# Patient Record
Sex: Female | Born: 1990
Health system: Southern US, Community
[De-identification: ages and names within clinical notes are randomized; demographics above are authoritative.]

## PROBLEM LIST (undated history)

## (undated) DIAGNOSIS — D72819 Decreased white blood cell count, unspecified: Principal | ICD-10-CM

## (undated) DIAGNOSIS — R7303 Prediabetes: Secondary | ICD-10-CM

## (undated) HISTORY — DX: Decreased white blood cell count, unspecified: D72.819

---
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
## 2011-08-17 ENCOUNTER — Emergency Department (HOSPITAL_COMMUNITY)
Admission: EM | Admit: 2011-08-17 | Discharge: 2011-08-18 | Disposition: A | Discharge status: Home / Self Care | Payer: Managed Care, Other (non HMO) | Attending: Emergency Medicine | Admitting: Emergency Medicine

## 2011-08-17 DIAGNOSIS — N72 Inflammatory disease of cervix uteri: Secondary | ICD-10-CM | POA: Insufficient documentation

## 2011-08-17 DIAGNOSIS — E119 Type 2 diabetes mellitus without complications: Secondary | ICD-10-CM | POA: Insufficient documentation

## 2011-08-17 DIAGNOSIS — R110 Nausea: Secondary | ICD-10-CM | POA: Insufficient documentation

## 2011-08-17 DIAGNOSIS — M545 Low back pain, unspecified: Secondary | ICD-10-CM | POA: Insufficient documentation

## 2011-08-17 DIAGNOSIS — R109 Unspecified abdominal pain: Secondary | ICD-10-CM | POA: Insufficient documentation

## 2011-08-17 DIAGNOSIS — N739 Female pelvic inflammatory disease, unspecified: Secondary | ICD-10-CM | POA: Insufficient documentation

## 2011-08-17 LAB — WET PREP, GENITAL: Yeast Wet Prep HPF POC: NONE SEEN

## 2011-08-17 LAB — URINALYSIS, ROUTINE W REFLEX MICROSCOPIC
Bilirubin Urine: NEGATIVE
Glucose, UA: NEGATIVE mg/dL
Hgb urine dipstick: NEGATIVE
Ketones, ur: NEGATIVE mg/dL
Protein, ur: NEGATIVE mg/dL
Urobilinogen, UA: 1 mg/dL (ref 0.0–1.0)

## 2011-08-18 LAB — GC/CHLAMYDIA PROBE AMP, GENITAL
Chlamydia, DNA Probe: NEGATIVE
GC Probe Amp, Genital: NEGATIVE

## 2011-08-19 LAB — URINE CULTURE: Culture  Setup Time: 201209220542

## 2011-10-03 ENCOUNTER — Emergency Department (HOSPITAL_COMMUNITY): Payer: Managed Care, Other (non HMO)

## 2011-10-03 ENCOUNTER — Emergency Department (HOSPITAL_COMMUNITY)
Admission: EM | Admit: 2011-10-03 | Discharge: 2011-10-03 | Disposition: A | Discharge status: Home / Self Care | Payer: Managed Care, Other (non HMO) | Attending: Emergency Medicine | Admitting: Emergency Medicine

## 2011-10-03 DIAGNOSIS — N898 Other specified noninflammatory disorders of vagina: Secondary | ICD-10-CM | POA: Insufficient documentation

## 2011-10-03 DIAGNOSIS — F17200 Nicotine dependence, unspecified, uncomplicated: Secondary | ICD-10-CM | POA: Insufficient documentation

## 2011-10-03 DIAGNOSIS — E119 Type 2 diabetes mellitus without complications: Secondary | ICD-10-CM | POA: Insufficient documentation

## 2011-10-03 DIAGNOSIS — R109 Unspecified abdominal pain: Secondary | ICD-10-CM | POA: Insufficient documentation

## 2011-10-03 DIAGNOSIS — O200 Threatened abortion: Secondary | ICD-10-CM | POA: Insufficient documentation

## 2011-10-03 LAB — TYPE AND SCREEN: Antibody Screen: NEGATIVE

## 2011-10-03 LAB — BASIC METABOLIC PANEL
Chloride: 102 mEq/L (ref 96–112)
GFR calc Af Amer: >90 mL/min (ref >90)
GFR calc non Af Amer: >90 mL/min (ref >90)
Potassium: 3.4 mEq/L — ABNORMAL LOW (ref 3.5–5.1)
Sodium: 138 mEq/L (ref 135–145)

## 2011-10-03 LAB — POCT PREGNANCY, URINE: Preg Test, Ur: POSITIVE

## 2011-10-03 LAB — URINALYSIS, ROUTINE W REFLEX MICROSCOPIC
Bilirubin Urine: NEGATIVE
Glucose, UA: NEGATIVE mg/dL
Leukocytes, UA: NEGATIVE
Nitrite: NEGATIVE
Specific Gravity, Urine: 1.014 (ref 1.005–1.030)
pH: 7.5 (ref 5.0–8.0)

## 2011-10-03 LAB — WET PREP, GENITAL: Yeast Wet Prep HPF POC: NONE SEEN

## 2011-10-03 NOTE — ED Notes (Signed)
Just found out she was pregnant Monday and then went to the health dept, bleeding vaginally.

## 2011-10-03 NOTE — ED Notes (Signed)
Family at bedside. 

## 2011-10-03 NOTE — ED Notes (Signed)
Patient is resting comfortably. 

## 2011-10-03 NOTE — ED Provider Notes (Signed)
History     CSN: [id number] Arrival date & time: 10/03/2011 11:52 AM   First MD Initiated Contact with Patient 10/03/11 1316      Chief Complaint  Patient presents with  . Miscarriage    seen at [facility]     HPI 4:00 PM Patient seen and evaluated for vaginal bleeding. Patient did a home pregnancy test on Monday, that showed she was pregnant. Patient was seen at the health department today for evaluation of her vaginal bleeding that started yesterday. Patient denies any nausea, vomiting, fevers, back pain. Patient reports having some mild abdominal cramping. Patient is G1P0. Patient reports that she was diagnosed as being a diabetic, diet controlled. Reports that she has not ate today.   Past Medical History  Diagnosis Date  . Diabetes mellitus     History reviewed. No pertinent past surgical history.  History reviewed. No pertinent family history.  History  Substance Use Topics  . Smoking status: Current Everyday Smoker -- 0.5 packs/day    Types: Cigarettes  . Smokeless tobacco: Not on file  . Alcohol Use: Yes     socially.    OB History    Grav Para Term Preterm Abortions TAB SAB Ect Mult Living                  Review of Systems  Constitutional: Negative for fever, chills, diaphoresis and activity change.  HENT: Negative for neck pain and neck stiffness.   Eyes: Negative for photophobia and visual disturbance.  Respiratory: Negative for shortness of breath.   Cardiovascular: Negative for chest pain and leg swelling.  Gastrointestinal: Negative for nausea, vomiting, diarrhea, constipation and blood in stool.       Minimal abdominal cramping, intermittent in nature  Genitourinary: Positive for vaginal bleeding. Negative for dysuria, flank pain, vaginal discharge, vaginal pain and pelvic pain.  Musculoskeletal: Negative for gait problem.  Skin: Negative for color change and rash.  Neurological: Negative for dizziness, seizures and facial asymmetry.  All other  systems reviewed and are negative.    Allergies  Review of patient's allergies indicates no known allergies.  Home Medications  No current outpatient prescriptions on file.  BP 119/74  Pulse 90  Temp(Src) 98 F (36.7 C) (Oral)  Resp 16  SpO2 98%  LMP 08/26/2011  4:03 PM PE completed. Physical Exam  Nursing note and vitals reviewed. Constitutional: She is oriented to person, place, and time. She appears well-developed and well-nourished. No distress.  HENT:  Head: Normocephalic and atraumatic.  Eyes: EOM are normal. Pupils are equal, round, and reactive to light.  Neck: Normal range of motion.  Abdominal: Soft. Bowel sounds are normal. She exhibits no distension and no mass. There is no tenderness. There is no rebound and no guarding.  Genitourinary: Vagina normal and uterus normal. Cervix exhibits no discharge and no friability.       Chaperone present during exam. No fetal tissue in the cervix. Cervix closed. Minimal vaginal discharge. Minimal vaginal bleeding.    Musculoskeletal: Normal range of motion.  Neurological: She is alert and oriented to person, place, and time. She exhibits normal muscle tone.  Skin: Skin is warm and dry. No rash noted. She is not diaphoretic.  Psychiatric: She has a normal mood and affect. Her behavior is normal. Judgment and thought content normal.      ED Course  Procedures (including critical care time)  Patient seen and evaluated.  VS reviewed. Nursing notes reviewed. Discussed with attending physician, Dr. Weldon Inches.  Initial testing ordered. Will monitor the patient closely. They agree with the treatment plan and diagnosis.   Results for orders placed during the hospital encounter of 10/03/11  URINALYSIS, ROUTINE W REFLEX MICROSCOPIC      Component Value Range   Color, Urine YELLOW  YELLOW    Appearance CLEAR  CLEAR    Specific Gravity, Urine 1.014  1.005 - 1.030    pH 7.5  5.0 - 8.0    Glucose, UA NEGATIVE  NEGATIVE (mg/dL)   Hgb  urine dipstick NEGATIVE  NEGATIVE    Bilirubin Urine NEGATIVE  NEGATIVE    Ketones, ur NEGATIVE  NEGATIVE (mg/dL)   Protein, ur NEGATIVE  NEGATIVE (mg/dL)   Urobilinogen, UA 1.0  0.0 - 1.0 (mg/dL)   Nitrite NEGATIVE  NEGATIVE    Leukocytes, UA NEGATIVE  NEGATIVE   TYPE AND SCREEN      Component Value Range   ABO/RH(D) O POS     Antibody Screen NEG     Sample Expiration 10/06/2011    POCT PREGNANCY, URINE      Component Value Range   Preg Test, Ur POSITIVE    ABO/RH      Component Value Range   ABO/RH(D) O POS    HCG, QUANTITATIVE, PREGNANCY      Component Value Range   hCG, Beta Chain, Quant, S 1973 (*) <5 (mIU/mL)  BASIC METABOLIC PANEL      Component Value Range   Sodium 138  135 - 145 (mEq/L)   Potassium 3.4 (*) 3.5 - 5.1 (mEq/L)   Chloride 102  96 - 112 (mEq/L)   CO2 25  19 - 32 (mEq/L)   Glucose, Bld 65 (*) 70 - 99 (mg/dL)   BUN 6  6 - 23 (mg/dL)   Creatinine, Ser 6.96  0.50 - 1.10 (mg/dL)   Calcium 9.4  8.4 - 29.5 (mg/dL)   GFR calc non Af Amer >90  >90 (mL/min)   GFR calc Af Amer >90  >90 (mL/min)   US Ob Comp Less 14 Wks  10/03/2011  *RADIOLOGY REPORT*  Clinical Data: Pregnant with vaginal bleeding  OBSTETRIC <14 WK [place] AND TRANSVAGINAL OB US  Technique:  Both transabdominal and transvaginal ultrasound examinations were performed for complete evaluation of the gestation as well as the maternal uterus, adnexal regions, and pelvic cul-de-sac.  Transvaginal technique was performed to assess early pregnancy.  Comparison:  None.  Intrauterine gestational sac: Single gestational sac is visualized Yolk sac: Not visualized Embryo: Not visualized Cardiac Activity: Not visualized  MSD: 5.1  mm  5    w 2    d             [place] EDC: 06/02/1930  Maternal uterus/adnexae: Bilateral ovary is normal sized without evidence of ovarian cyst or adnexal mass.  No pelvic free fluid.  No subchorionic hemorrhage.  IMPRESSION: Single intrauterine gestational sac measuring 5.1 mm corresponding to  gestational age of [redacted] weeks 2 days. No embryonic pole, yolk sac or cardiac activity identified.  The findings may be due to early pregnancy, blighted ovum  or incomplete abortion.  Correlation with serial beta HCG level, clinical exam and follow-up pelvic ultrasound is recommended.  Original Report Authenticated By: Natasha Mead, M.D.   US Ob Transvaginal  10/03/2011  *RADIOLOGY REPORT*  Clinical Data: Pregnant with vaginal bleeding  OBSTETRIC <14 WK [place] AND TRANSVAGINAL OB US  Technique:  Both transabdominal and transvaginal ultrasound examinations were performed for complete evaluation of the gestation as  well as the maternal uterus, adnexal regions, and pelvic cul-de-sac.  Transvaginal technique was performed to assess early pregnancy.  Comparison:  None.  Intrauterine gestational sac: Single gestational sac is visualized Yolk sac: Not visualized Embryo: Not visualized Cardiac Activity: Not visualized  MSD: 5.1  mm  5    w 2    d             [place] EDC: 06/02/1930  Maternal uterus/adnexae: Bilateral ovary is normal sized without evidence of ovarian cyst or adnexal mass.  No pelvic free fluid.  No subchorionic hemorrhage.  IMPRESSION: Single intrauterine gestational sac measuring 5.1 mm corresponding to gestational age of [redacted] weeks 2 days. No embryonic pole, yolk sac or cardiac activity identified.  The findings may be due to early pregnancy, blighted ovum  or incomplete abortion.  Correlation with serial beta HCG level, clinical exam and follow-up pelvic ultrasound is recommended.  Original Report Authenticated By: Natasha Mead, M.D.   5:11 PM Patient seen and re-evaluated. Resting comfortably. VSS stable. NAD. Patient notified of testing results. Stated agreement and understanding. Patient stated understanding to treatment plan and diagnosis. Patients blood glucose level was mildly low at 65, patient is alert and oriented x 3. No distress. Patient given food in the department. No medication taken today. Patient is not on  any anti-hyperglycemic medications.   6:35 PM patient seen and re-evaluated. Resting comfortably. NAD. Patient ate some food. CBG 87, will send home with gynecology follow up. Patient stated agreement and understanding.   MDM   Threatened Miscarriage         Demetrius Charity, [place] 10/03/11 1836

## 2011-10-03 NOTE — ED Provider Notes (Signed)
History     CSN: [id number] Arrival date & time: 10/03/2011 11:52 AM   First MD Initiated Contact with Patient 10/03/11 1316      Chief Complaint  Patient presents with  . Miscarriage    [facility]     (Consider location/radiation/quality/duration/timing/severity/associated sxs/prior treatment) HPI  Past Medical History  Diagnosis Date  . Diabetes mellitus     History reviewed. No pertinent past surgical history.  History reviewed. No pertinent family history.  History  Substance Use Topics  . Smoking status: Current Everyday Smoker -- 0.5 packs/day    Types: Cigarettes  . Smokeless tobacco: Not on file  . Alcohol Use: Yes     socially.    OB History    Grav Para Term Preterm Abortions TAB SAB Ect Mult Living                  Review of Systems  Allergies  Review of patient's allergies indicates no known allergies.  Home Medications  No current outpatient prescriptions on file.  BP 119/74  Pulse 90  Temp(Src) 98 F (36.7 C) (Oral)  Resp 16  SpO2 98%  LMP 08/26/2011  Physical Exam  ED Course  Procedures (including critical care time)  Labs Reviewed - No data to display No results found.   No diagnosis found.    MDM  Medical screening exam. Patient seen at health department this morning she was pregnant patient has had low bowel cramping and vaginal bleeding since yesterday. Patient told she may be having a miscarriage. On exam alert nontoxic-appearing Glasgow Coma Score 15 appears comfortable abdomen soft nontender        Doug Sou, MD 10/03/11 1323

## 2011-10-03 NOTE — ED Notes (Signed)
Informed patient that we need a urine sample.  

## 2011-10-04 LAB — GC/CHLAMYDIA PROBE AMP, GENITAL
Chlamydia, DNA Probe: NEGATIVE
GC Probe Amp, Genital: NEGATIVE

## 2011-10-04 NOTE — ED Provider Notes (Signed)
Medical screening examination/treatment/procedure(s) were performed by non-physician practitioner and as supervising physician I was immediately available for consultation/collaboration.  Nicholes Stairs, MD 10/04/11 [phone]

## 2011-10-10 ENCOUNTER — Emergency Department (HOSPITAL_COMMUNITY)
Admission: EM | Admit: 2011-10-10 | Discharge: 2011-10-10 | Disposition: A | Discharge status: Home / Self Care | Payer: Managed Care, Other (non HMO) | Attending: Emergency Medicine | Admitting: Emergency Medicine

## 2011-10-10 ENCOUNTER — Encounter (HOSPITAL_COMMUNITY): Payer: Self-pay | Admitting: Emergency Medicine

## 2011-10-10 DIAGNOSIS — R109 Unspecified abdominal pain: Secondary | ICD-10-CM | POA: Insufficient documentation

## 2011-10-10 DIAGNOSIS — N898 Other specified noninflammatory disorders of vagina: Secondary | ICD-10-CM | POA: Insufficient documentation

## 2011-10-10 DIAGNOSIS — M549 Dorsalgia, unspecified: Secondary | ICD-10-CM | POA: Insufficient documentation

## 2011-10-10 DIAGNOSIS — E119 Type 2 diabetes mellitus without complications: Secondary | ICD-10-CM | POA: Insufficient documentation

## 2011-10-10 NOTE — ED Notes (Signed)
Patient states is pregnant, Back and abdomen pain started Tuesday, bleeding started today,

## 2011-10-10 NOTE — ED Notes (Signed)
Awaiting pt from triage area

## 2011-10-11 NOTE — ED Provider Notes (Signed)
History     CSN: [id number] Arrival date & time: 10/10/2011  2:05 PM   First MD Initiated Contact with Patient 10/10/11 1741      Chief Complaint  Patient presents with  . Abdominal Pain  . Back Pain  . Vaginal Bleeding    (Consider location/radiation/quality/duration/timing/severity/associated sxs/prior treatment) HPI  Past Medical History  Diagnosis Date  . Diabetes mellitus     History reviewed. No pertinent past surgical history.  No family history on file.  History  Substance Use Topics  . Smoking status: Current Everyday Smoker -- 1.0 packs/day    Types: Cigarettes  . Smokeless tobacco: Never Used  . Alcohol Use: No     socially.    OB History    Grav Para Term Preterm Abortions TAB SAB Ect Mult Living                  Review of Systems  Allergies  Review of patient's allergies indicates no known allergies.  Home Medications  No current outpatient prescriptions on file.  BP 108/67  Pulse 72  Temp(Src) 98.2 F (36.8 C) (Oral)  Resp 20  SpO2 99%  LMP 08/26/2011  Physical Exam  ED Course  Procedures (including critical care time)  Labs Reviewed - No data to display No results found.   1. Abdominal pain       MDM  Patient lwsb prior to my evaluation.       Hilario Quarry, MD 10/11/11 [phone]

## 2012-02-07 ENCOUNTER — Emergency Department (HOSPITAL_COMMUNITY)
Admission: EM | Admit: 2012-02-07 | Discharge: 2012-02-07 | Disposition: A | Discharge status: Home / Self Care | Payer: Managed Care, Other (non HMO) | Attending: Emergency Medicine | Admitting: Emergency Medicine

## 2012-02-07 ENCOUNTER — Encounter (HOSPITAL_COMMUNITY): Payer: Self-pay | Admitting: Nurse Practitioner

## 2012-02-07 DIAGNOSIS — L309 Dermatitis, unspecified: Secondary | ICD-10-CM

## 2012-02-07 DIAGNOSIS — F17200 Nicotine dependence, unspecified, uncomplicated: Secondary | ICD-10-CM | POA: Insufficient documentation

## 2012-02-07 DIAGNOSIS — E119 Type 2 diabetes mellitus without complications: Secondary | ICD-10-CM | POA: Insufficient documentation

## 2012-02-07 DIAGNOSIS — L259 Unspecified contact dermatitis, unspecified cause: Secondary | ICD-10-CM | POA: Insufficient documentation

## 2012-02-07 MED ORDER — FAMOTIDINE 20 MG PO TABS: 20.0000 mg | ORAL_TABLET | Freq: Two times a day (BID) | ORAL | Status: DC

## 2012-02-07 NOTE — ED Provider Notes (Signed)
History     CSN: [id number]  Arrival date & time 02/07/12  1441   First MD Initiated Contact with Patient 02/07/12 1533      Chief Complaint  Patient presents with  . Rash    (Consider location/radiation/quality/duration/timing/severity/associated sxs/prior treatment) Patient is a 21 y.o. female presenting with rash. The history is provided by the patient. No language interpreter was used.  Rash  This is a new problem. The current episode started yesterday. The problem has been gradually improving. The problem is associated with an unknown factor. There has been no fever. The rash is present on the torso, right upper leg and left upper leg. The patient is experiencing no pain. The pain has been fluctuating since onset. Associated symptoms include blisters. Pertinent negatives include no itching. She has tried antihistamines for the symptoms. The treatment provided significant relief.  No known exposure to environmental factors.  Recent viral illness.  Significant improvement after benadryl just PTA.  Past Medical History  Diagnosis Date  . Diabetes mellitus     History reviewed. No pertinent past surgical history.  History reviewed. No pertinent family history.  History  Substance Use Topics  . Smoking status: Current Everyday Smoker -- 0.5 packs/day    Types: Cigarettes  . Smokeless tobacco: Never Used  . Alcohol Use: Yes     socially.    OB History    Grav Para Term Preterm Abortions TAB SAB Ect Mult Living                  Review of Systems  Skin: Positive for rash. Negative for itching.  All other systems reviewed and are negative.    Allergies  Review of patient's allergies indicates no known allergies.  Home Medications   Current Outpatient Rx  Name Route Sig Dispense Refill  . DIPHENHYDRAMINE HCL 25 MG PO CAPS Oral Take 25 mg by mouth every 6 (six) hours as needed. For itching    . ORTHO-CYCLEN (28) PO Oral Take 1 tablet by mouth daily.      BP  104/70  Pulse 90  Temp(Src) 97.4 F (36.3 C) (Oral)  Resp 18  Ht 5\' 6"  (1.676 m)  Wt 155 lb (70.308 kg)  BMI 25.02 kg/m2  SpO2 100%  Physical Exam  Constitutional: She is oriented to person, place, and time. She appears well-developed and well-nourished.  HENT:  Head: Normocephalic and atraumatic.  Eyes: Pupils are equal, round, and reactive to light.  Neck: Normal range of motion. Neck supple.  Cardiovascular: Normal rate, regular rhythm, normal heart sounds and intact distal pulses.   Pulmonary/Chest: Effort normal and breath sounds normal.  Abdominal: Soft. Bowel sounds are normal.  Musculoskeletal: Normal range of motion. She exhibits no edema and no tenderness.  Lymphadenopathy:    She has no cervical adenopathy.  Neurological: She is alert and oriented to person, place, and time.  Skin: Skin is warm and dry. Rash noted.  Psychiatric: She has a normal mood and affect. Her behavior is normal. Judgment and thought content normal.  Mild papular rash noted to waistline, bilateral thighs.  ED Course  Procedures (including critical care time)  Labs Reviewed - No data to display No results found.   No diagnosis found.  Dermatitis--contact vs viral exanthem  MDM          Jimmye Norman, NP 02/07/12 [phone]

## 2012-02-07 NOTE — Discharge Instructions (Signed)
Contact Dermatitis Contact dermatitis is a rash that happens when something touches the skin. You touched something that irritates your skin, or you have allergies to something you touched. HOME CARE   Avoid the thing that caused your rash.   Keep your rash away from hot water, soap, sunlight, chemicals, and other things that might bother it.   Do not scratch your rash.   You can take cool baths to help stop itching.   Only take medicine as told by your doctor.   Keep all doctor visits as told.  GET HELP RIGHT AWAY IF:   Your rash is not better after 3 days.   Your rash gets worse.   Your rash is puffy (swollen), tender, red, sore, or warm.   You have problems with your medicine.  MAKE SURE YOU:   Understand these instructions.   Will watch your condition.   Will get help right away if you are not doing well or get worse.  Document Released: 09/09/2009 Document Revised: 11/01/2011 Document Reviewed: 04/17/2011 [facility] Patient Information 2012 [place], [place].  Continue to use the benadryl, 25 mg, every 8 hours as needed.  Add the pepcid as directed.  Follow-up with your primary care provider if no improvement over the next few days.

## 2012-02-07 NOTE — ED Notes (Signed)
States onset lacy red rash around beltline and on legs yesterday. "Feels like its burning my skin." Denies new meds/products/foods. Does not itch. Tried benadryl with no relief

## 2012-02-07 NOTE — ED Provider Notes (Signed)
Medical screening examination/treatment/procedure(s) were performed by non-physician practitioner and as supervising physician I was immediately available for consultation/collaboration.  Doug Sou, MD 02/07/12 2022

## 2012-08-13 ENCOUNTER — Encounter (HOSPITAL_BASED_OUTPATIENT_CLINIC_OR_DEPARTMENT_OTHER): Payer: Self-pay

## 2012-08-13 ENCOUNTER — Emergency Department (HOSPITAL_BASED_OUTPATIENT_CLINIC_OR_DEPARTMENT_OTHER): Payer: Worker's Compensation

## 2012-08-13 ENCOUNTER — Emergency Department (HOSPITAL_BASED_OUTPATIENT_CLINIC_OR_DEPARTMENT_OTHER)
Admission: EM | Admit: 2012-08-13 | Discharge: 2012-08-13 | Disposition: A | Discharge status: Home / Self Care | Payer: Worker's Compensation | Attending: Emergency Medicine | Admitting: Emergency Medicine

## 2012-08-13 DIAGNOSIS — T148XXA Other injury of unspecified body region, initial encounter: Secondary | ICD-10-CM

## 2012-08-13 DIAGNOSIS — E119 Type 2 diabetes mellitus without complications: Secondary | ICD-10-CM | POA: Insufficient documentation

## 2012-08-13 DIAGNOSIS — X500XXA Overexertion from strenuous movement or load, initial encounter: Secondary | ICD-10-CM | POA: Insufficient documentation

## 2012-08-13 DIAGNOSIS — Z87891 Personal history of nicotine dependence: Secondary | ICD-10-CM | POA: Insufficient documentation

## 2012-08-13 DIAGNOSIS — M546 Pain in thoracic spine: Secondary | ICD-10-CM | POA: Insufficient documentation

## 2012-08-13 MED ORDER — HYDROCODONE-ACETAMINOPHEN 5-325 MG PO TABS: 2.0000 | ORAL_TABLET | ORAL | Status: DC | PRN

## 2012-08-13 NOTE — ED Notes (Signed)
Moving desk at work yesterday "felt a pop" to left mid back-c/opain to left mid back-left arm and shoulder numbness

## 2012-08-13 NOTE — ED Provider Notes (Signed)
Medical screening examination/treatment/procedure(s) were performed by non-physician practitioner and as supervising physician I was immediately available for consultation/collaboration.  Doug Sou, MD 08/13/12 [phone]

## 2012-08-13 NOTE — ED Provider Notes (Signed)
History     CSN: [id number]  Arrival date & time 08/13/12  1657   First MD Initiated Contact with Patient 08/13/12 1736      Chief Complaint  Patient presents with  . Back Pain    (Consider location/radiation/quality/duration/timing/severity/associated sxs/prior treatment) Patient is a 21 y.o. female presenting with back pain. The history is provided by the patient. No language interpreter was used.  Back Pain  This is a new problem. The current episode started yesterday. The problem occurs constantly. The problem has been gradually worsening. The pain is associated with lifting heavy objects. The pain is present in the thoracic spine. The quality of the pain is described as stabbing. The pain is at a severity of 6/10. The pain is moderate. The pain is the same all the time. She has tried nothing for the symptoms. The treatment provided moderate relief.   Pt complains of pain in her back after moving furniture yesterday Past Medical History  Diagnosis Date  . Diabetes mellitus     History reviewed. No pertinent past surgical history.  No family history on file.  History  Substance Use Topics  . Smoking status: Former Smoker -- 0.5 packs/day  . Smokeless tobacco: Never Used  . Alcohol Use: No    OB History    Grav Para Term Preterm Abortions TAB SAB Ect Mult Living                  Review of Systems  Musculoskeletal: Positive for back pain.  All other systems reviewed and are negative.    Allergies  Review of patient's allergies indicates no known allergies.  Home Medications   Current Outpatient Rx  Name Route Sig Dispense Refill  . LEVONORGESTREL 20 MCG/24HR IU IUD Intrauterine 1 each by Intrauterine route once.      BP 109/63  Pulse 85  Temp 98.1 F (36.7 C) (Oral)  Resp 16  Ht 5\' 6"  (1.676 m)  Wt 160 lb (72.576 kg)  BMI 25.82 kg/m2  SpO2 98%  Physical Exam  Nursing note and vitals reviewed. Constitutional: She is oriented to person, place, and  time. She appears well-developed and well-nourished.  HENT:  Head: Normocephalic.  Eyes: Pupils are equal, round, and reactive to light.  Neck: Normal range of motion.  Cardiovascular: Normal rate.   Pulmonary/Chest: Effort normal.  Abdominal: Soft.  Musculoskeletal: She exhibits tenderness.       Tender mid back,  From arms and legs,    Neurological: She is alert and oriented to person, place, and time. She has normal reflexes. She displays normal reflexes.  Skin: Skin is warm.  Psychiatric: She has a normal mood and affect.    ED Course  Procedures (including critical care time)  Labs Reviewed - No data to display Dg Chest 2 View  08/13/2012  *RADIOLOGY REPORT*  Clinical Data: Pain  CHEST - 2 VIEW  Comparison: None  Findings: The heart size and mediastinal contours are within normal limits.  Both lungs are clear.  The visualized skeletal structures are unremarkable.  IMPRESSION: Negative exam.   Original Report Authenticated By: Rosealee Albee, M.D.      No diagnosis found.    MDM  Pt given rx for hydrocodone.   I advised ice,  Follow up with Dr. Pearletha Forge.  No liftin x 1 week        Lonia Skinner [place], [place] 08/13/12 1926

## 2012-08-21 ENCOUNTER — Emergency Department (HOSPITAL_BASED_OUTPATIENT_CLINIC_OR_DEPARTMENT_OTHER)
Admission: EM | Admit: 2012-08-21 | Discharge: 2012-08-21 | Disposition: A | Discharge status: Home / Self Care | Payer: Managed Care, Other (non HMO)

## 2012-08-22 ENCOUNTER — Encounter (HOSPITAL_COMMUNITY): Payer: Self-pay | Admitting: *Deleted

## 2012-08-22 ENCOUNTER — Emergency Department (HOSPITAL_COMMUNITY)
Admission: EM | Admit: 2012-08-22 | Discharge: 2012-08-23 | Disposition: A | Discharge status: Home / Self Care | Payer: Managed Care, Other (non HMO) | Attending: Emergency Medicine | Admitting: Emergency Medicine

## 2012-08-22 DIAGNOSIS — R102 Pelvic and perineal pain: Secondary | ICD-10-CM

## 2012-08-22 DIAGNOSIS — R10819 Abdominal tenderness, unspecified site: Secondary | ICD-10-CM | POA: Insufficient documentation

## 2012-08-22 DIAGNOSIS — N949 Unspecified condition associated with female genital organs and menstrual cycle: Secondary | ICD-10-CM | POA: Insufficient documentation

## 2012-08-22 DIAGNOSIS — F17200 Nicotine dependence, unspecified, uncomplicated: Secondary | ICD-10-CM | POA: Insufficient documentation

## 2012-08-22 DIAGNOSIS — E119 Type 2 diabetes mellitus without complications: Secondary | ICD-10-CM | POA: Insufficient documentation

## 2012-08-22 DIAGNOSIS — IMO0002 Reserved for concepts with insufficient information to code with codable children: Secondary | ICD-10-CM

## 2012-08-22 LAB — CBC WITH DIFFERENTIAL/PLATELET
Basophils Relative: 1 % (ref 0–1)
Eosinophils Relative: 5 % (ref 0–5)
HCT: 40.6 % (ref 36.0–46.0)
Hemoglobin: 13.8 g/dL (ref 12.0–15.0)
Lymphocytes Relative: 24 % (ref 12–46)
MCHC: 34 g/dL (ref 30.0–36.0)
MCV: 90 fL (ref 78.0–100.0)
Monocytes Absolute: 1 10*3/uL (ref 0.1–1.0)
Monocytes Relative: 8 % (ref 3–12)
Neutro Abs: 7.9 10*3/uL — ABNORMAL HIGH (ref 1.7–7.7)

## 2012-08-22 LAB — PREGNANCY, URINE: Preg Test, Ur: NEGATIVE

## 2012-08-22 NOTE — ED Notes (Signed)
Pt c/o severe abd pain since yesterday; nausea no vomiting; last bm Monday

## 2012-08-22 NOTE — ED Notes (Signed)
Pt reports generalized abdominal pain that started 1200 yesterday after intercourse. Pt reports "it hurt at the beginning then it became excruciating. I jumped up and went to the bathroom. It was the worst pain I've ever felt." Pt reports taking 2 hydrocodone after being seen at [facility] yesterday but to no relief. Pt reports nausea, no vomiting. Denies diarrhea.

## 2012-08-22 NOTE — ED Notes (Signed)
Pt ambulated to bathroom 

## 2012-08-22 NOTE — ED Notes (Addendum)
Pt reports "chest pain, but it seems like heartburn. I haven't had heartburn before, but I think it's what it feels like." Pt reports the pain is worse laying down. Pt placed on monitor, NSR.

## 2012-08-23 ENCOUNTER — Emergency Department (HOSPITAL_COMMUNITY): Payer: Managed Care, Other (non HMO)

## 2012-08-23 LAB — URINE MICROSCOPIC-ADD ON

## 2012-08-23 LAB — COMPREHENSIVE METABOLIC PANEL
BUN: 7 mg/dL (ref 6–23)
CO2: 26 mEq/L (ref 19–32)
Calcium: 9.4 mg/dL (ref 8.4–10.5)
Chloride: 102 mEq/L (ref 96–112)
Creatinine, Ser: 0.63 mg/dL (ref 0.50–1.10)
GFR calc non Af Amer: >90 mL/min (ref >90)
Total Bilirubin: 0.3 mg/dL (ref 0.3–1.2)

## 2012-08-23 LAB — URINALYSIS, ROUTINE W REFLEX MICROSCOPIC
Bilirubin Urine: NEGATIVE
Glucose, UA: NEGATIVE mg/dL
Hgb urine dipstick: NEGATIVE
Ketones, ur: NEGATIVE mg/dL
Protein, ur: NEGATIVE mg/dL

## 2012-08-23 LAB — GC/CHLAMYDIA PROBE AMP, GENITAL
Chlamydia, DNA Probe: NEGATIVE
GC Probe Amp, Genital: NEGATIVE

## 2012-08-23 MED ORDER — HYDROCODONE-ACETAMINOPHEN 5-325 MG PO TABS
2.0000 | ORAL_TABLET | Freq: Once | ORAL | Status: AC
  Administered 2012-08-23: 2 via ORAL
  Filled 2012-08-23: qty 2

## 2012-08-23 MED ORDER — HYDROCODONE-ACETAMINOPHEN 5-325 MG PO TABS: 2.0000 | ORAL_TABLET | ORAL | Status: DC | PRN

## 2012-08-23 MED ORDER — NAPROXEN 500 MG PO TABS
500.0000 mg | ORAL_TABLET | Freq: Once | ORAL | Status: AC
Start: 2012-08-23 — End: 2012-08-23
  Administered 2012-08-23: 500 mg via ORAL
  Filled 2012-08-23: qty 1

## 2012-08-23 MED ORDER — NAPROXEN 500 MG PO TABS: 500.0000 mg | ORAL_TABLET | Freq: Two times a day (BID) | ORAL | Status: DC

## 2012-08-23 NOTE — ED Provider Notes (Signed)
History     CSN: [id number]  Arrival date & time 08/22/12  2306   First MD Initiated Contact with Patient 08/22/12 2329      Chief Complaint  Patient presents with  . Abdominal Pain    (Consider location/radiation/quality/duration/timing/severity/associated sxs/prior treatment) HPI 21 year old female presents to emergency room complaining of pelvic pain since having sex yesterday. Patient reports severe lower pelvic pain at the end of intercourse. She denies any vaginal bleeding, no discharge no fever or chills. Patient's last bowel movement was Tuesday, which is normal for her. No rough sex, voice, or unusual positions. Patient has IUD in place. Patient describes distention and pressure to her lower abdomen. Patient has taken Vicodin without improvement in symptoms yesterday. Patient went to meds or [place], but left without being seen. She's had nausea but no vomiting. Past Medical History  Diagnosis Date  . Diabetes mellitus     History reviewed. No pertinent past surgical history.  No family history on file.  History  Substance Use Topics  . Smoking status: Current Every Day Smoker -- 0.5 packs/day    Types: Cigarettes  . Smokeless tobacco: Never Used  . Alcohol Use: No    OB History    Grav Para Term Preterm Abortions TAB SAB Ect Mult Living                  Review of Systems  All other systems reviewed and are negative.    Allergies  Review of patient's allergies indicates no known allergies.  Home Medications   Current Outpatient Rx  Name Route Sig Dispense Refill  . HYDROCODONE-ACETAMINOPHEN 5-325 MG PO TABS Oral Take 2 tablets by mouth every 4 (four) hours as needed. For pain    . LEVONORGESTREL 20 MCG/24HR IU IUD Intrauterine 1 each by Intrauterine route once.      BP 117/68  Pulse 71  Temp 97.6 F (36.4 C)  Resp 22  SpO2 100%  Physical Exam  Nursing note and vitals reviewed. Constitutional: She is oriented to person, place, and time. She  appears well-developed and well-nourished. No distress.  HENT:  Head: Normocephalic and atraumatic.  Right Ear: External ear normal.  Left Ear: External ear normal.  Nose: Nose normal.  Mouth/Throat: Oropharynx is clear and moist.  Eyes: Conjunctivae normal and EOM are normal. Pupils are equal, round, and reactive to light.  Neck: Normal range of motion. Neck supple. No JVD present. No tracheal deviation present. No thyromegaly present.  Cardiovascular: Normal rate, regular rhythm, normal heart sounds and intact distal pulses.  Exam reveals no gallop and no friction rub.   No murmur heard. Pulmonary/Chest: Effort normal and breath sounds normal. No stridor. No respiratory distress. She has no wheezes. She has no rales. She exhibits no tenderness.  Abdominal: Soft. Bowel sounds are normal. She exhibits no distension and no mass. There is tenderness (mild suprapubic tenderness). There is no rebound and no guarding.  Genitourinary: Vagina normal. No vaginal discharge found.       IUD in place, tenderness over entire pelvic exam without focality, masses or foreign objects noted  Musculoskeletal: Normal range of motion. She exhibits no edema and no tenderness.  Lymphadenopathy:    She has no cervical adenopathy.  Neurological: She is alert and oriented to person, place, and time. She exhibits normal muscle tone. Coordination normal.  Skin: Skin is warm and dry. No rash noted. No erythema. No pallor.  Psychiatric: She has a normal mood and affect. Her behavior is  normal. Judgment and thought content normal.    ED Course  Procedures (including critical care time)  Labs Reviewed  URINALYSIS, ROUTINE W REFLEX MICROSCOPIC - Abnormal; Notable for the following:    Leukocytes, UA SMALL (*)     All other components within normal limits  CBC WITH DIFFERENTIAL - Abnormal; Notable for the following:    WBC 12.5 (*)     Neutro Abs 7.9 (*)     All other components within normal limits  COMPREHENSIVE  METABOLIC PANEL - Abnormal; Notable for the following:    Potassium 3.4 (*)     All other components within normal limits  WET PREP, GENITAL - Abnormal; Notable for the following:    WBC, Wet Prep HPF POC FEW (*)     All other components within normal limits  URINE MICROSCOPIC-ADD ON - Abnormal; Notable for the following:    Squamous Epithelial / LPF FEW (*)     Bacteria, UA FEW (*)     All other components within normal limits  PREGNANCY, URINE  GC/CHLAMYDIA PROBE AMP, GENITAL   US Transvaginal Non-ob  08/23/2012  *RADIOLOGY REPORT*  Clinical Data:  Pelvic pain post intercourse.  Evaluate for ovarian torsion.  TRANSABDOMINAL AND TRANSVAGINAL ULTRASOUND OF PELVIS DOPPLER ULTRASOUND OF OVARIES  Technique:  Both transabdominal and transvaginal ultrasound examinations of the pelvis were performed. Transabdominal technique was performed for global imaging of the pelvis including uterus, ovaries, adnexal regions, and pelvic cul-de-sac.  It was necessary to proceed with endovaginal exam following the transabdominal exam to visualize the .  Color and duplex Doppler ultrasound was utilized to evaluate blood flow to the ovaries.  Comparison:  None.  Findings:  Uterus:  There is an IUD present.  Normal anteverted position of the uterus.  Uterus measures 7.5 x 3.2 x 5.2 cm.  Endometrium:  Endometrium is obscured by the IUD.  Right ovary: Right ovary measures 3.6 x 2.4 x 2.1 cm.  Small follicles within the right ovary.  Largest follicle measures up to 2.4 cm.  Arterial and venous flow within the right ovary.  Left ovary:   Left ovary measures 3.4 x 4.3 x 3.5 cm.  Small follicles in the left ovary.  There is an irregular follicle or involuting cyst in the left ovary that measures up to 2.4 cm. Arterial and venous flow within the left ovary.  Pulsed Doppler evaluation demonstrates normal low-resistance arterial and venous waveforms in both ovaries.  Small amount of fluid in the cul-de-sac.  IMPRESSION:  Bilateral  ovarian follicles.  There is an irregular shaped hypoechoic structure in the left ovary which could represent an involuting cyst.  Small amount of free fluid in the pelvis.  Presence of an IUD.  No sonographic evidence for ovarian torsion.   Original Report Authenticated By: Richarda Overlie, M.D.    US Pelvis Complete  08/23/2012  *RADIOLOGY REPORT*  Clinical Data:  Pelvic pain post intercourse.  Evaluate for ovarian torsion.  TRANSABDOMINAL AND TRANSVAGINAL ULTRASOUND OF PELVIS DOPPLER ULTRASOUND OF OVARIES  Technique:  Both transabdominal and transvaginal ultrasound examinations of the pelvis were performed. Transabdominal technique was performed for global imaging of the pelvis including uterus, ovaries, adnexal regions, and pelvic cul-de-sac.  It was necessary to proceed with endovaginal exam following the transabdominal exam to visualize the .  Color and duplex Doppler ultrasound was utilized to evaluate blood flow to the ovaries.  Comparison:  None.  Findings:  Uterus:  There is an IUD present.  Normal anteverted position  of the uterus.  Uterus measures 7.5 x 3.2 x 5.2 cm.  Endometrium:  Endometrium is obscured by the IUD.  Right ovary: Right ovary measures 3.6 x 2.4 x 2.1 cm.  Small follicles within the right ovary.  Largest follicle measures up to 2.4 cm.  Arterial and venous flow within the right ovary.  Left ovary:   Left ovary measures 3.4 x 4.3 x 3.5 cm.  Small follicles in the left ovary.  There is an irregular follicle or involuting cyst in the left ovary that measures up to 2.4 cm. Arterial and venous flow within the left ovary.  Pulsed Doppler evaluation demonstrates normal low-resistance arterial and venous waveforms in both ovaries.  Small amount of fluid in the cul-de-sac.  IMPRESSION:  Bilateral ovarian follicles.  There is an irregular shaped hypoechoic structure in the left ovary which could represent an involuting cyst.  Small amount of free fluid in the pelvis.  Presence of an IUD.  No  sonographic evidence for ovarian torsion.   Original Report Authenticated By: Richarda Overlie, M.D.    [place] Art/ven Flow Abd Pelv Doppler  08/23/2012  *RADIOLOGY REPORT*  Clinical Data:  Pelvic pain post intercourse.  Evaluate for ovarian torsion.  TRANSABDOMINAL AND TRANSVAGINAL ULTRASOUND OF PELVIS DOPPLER ULTRASOUND OF OVARIES  Technique:  Both transabdominal and transvaginal ultrasound examinations of the pelvis were performed. Transabdominal technique was performed for global imaging of the pelvis including uterus, ovaries, adnexal regions, and pelvic cul-de-sac.  It was necessary to proceed with endovaginal exam following the transabdominal exam to visualize the .  Color and duplex Doppler ultrasound was utilized to evaluate blood flow to the ovaries.  Comparison:  None.  Findings:  Uterus:  There is an IUD present.  Normal anteverted position of the uterus.  Uterus measures 7.5 x 3.2 x 5.2 cm.  Endometrium:  Endometrium is obscured by the IUD.  Right ovary: Right ovary measures 3.6 x 2.4 x 2.1 cm.  Small follicles within the right ovary.  Largest follicle measures up to 2.4 cm.  Arterial and venous flow within the right ovary.  Left ovary:   Left ovary measures 3.4 x 4.3 x 3.5 cm.  Small follicles in the left ovary.  There is an irregular follicle or involuting cyst in the left ovary that measures up to 2.4 cm. Arterial and venous flow within the left ovary.  Pulsed Doppler evaluation demonstrates normal low-resistance arterial and venous waveforms in both ovaries.  Small amount of fluid in the cul-de-sac.  IMPRESSION:  Bilateral ovarian follicles.  There is an irregular shaped hypoechoic structure in the left ovary which could represent an involuting cyst.  Small amount of free fluid in the pelvis.  Presence of an IUD.  No sonographic evidence for ovarian torsion.   Original Report Authenticated By: Richarda Overlie, M.D.      1. Pelvic pain in female   2. Dyspareunia       MDM  21 year old female with pelvic  pain after having intercourse. Workup unremarkable other than some diffuse tenderness on manual exam. Will treat with NSAIDs and continued Vicodin, heating packs, and abstinence from sex until feeling better.        Olivia Mackie, MD 08/23/12 [phone]

## 2012-08-23 NOTE — ED Notes (Signed)
US at bedside

## 2013-07-06 ENCOUNTER — Telehealth: Payer: Self-pay | Admitting: Hematology and Oncology

## 2013-07-06 NOTE — Telephone Encounter (Signed)
PT CALLED IN RE TO APPT PER PT SHE WILL CALL BACK TO MAKE APPT.

## 2013-07-06 NOTE — Telephone Encounter (Signed)
PT CALLED TO SCHEDULE NP APPT 08/28 @ 3 W/DR. VICTOR RFERRING DR. Sonny Masters SMITH DX- PERSISTENTLY ELEVATED WBC WELCOME PACKET MAILED.

## 2013-07-13 ENCOUNTER — Telehealth: Payer: Self-pay

## 2013-07-13 NOTE — Telephone Encounter (Signed)
C/D 07/13/13 for appt. 07/23/13

## 2013-07-23 ENCOUNTER — Ambulatory Visit (HOSPITAL_BASED_OUTPATIENT_CLINIC_OR_DEPARTMENT_OTHER): Payer: Managed Care, Other (non HMO) | Admitting: Hematology and Oncology

## 2013-07-23 ENCOUNTER — Encounter: Payer: Self-pay | Admitting: Hematology and Oncology

## 2013-07-23 ENCOUNTER — Ambulatory Visit: Payer: Managed Care, Other (non HMO)

## 2013-07-23 VITALS — BP 119/72 | HR 89 | Temp 98.4°F | Resp 20 | Ht 66.0 in | Wt 182.1 lb

## 2013-07-23 DIAGNOSIS — D72829 Elevated white blood cell count, unspecified: Secondary | ICD-10-CM

## 2013-07-23 DIAGNOSIS — F17200 Nicotine dependence, unspecified, uncomplicated: Secondary | ICD-10-CM

## 2013-07-23 NOTE — Progress Notes (Signed)
Checked in new patient with no financial issues. Her bcbs is primary and her [employer]et[employer] with dad is 2ndary. Didn't ask if poa/living will.

## 2013-07-24 ENCOUNTER — Telehealth: Payer: Self-pay | Admitting: Hematology and Oncology

## 2013-07-24 ENCOUNTER — Telehealth: Payer: Self-pay

## 2013-07-24 NOTE — Telephone Encounter (Signed)
lvm for pt for Sept and OCT appts...mailed pt appt sched avs and letter

## 2013-07-24 NOTE — Telephone Encounter (Signed)
pt called back to sched all appt for 8am on the same day.Marland KitchenMarland KitchenMarland KitchenDone

## 2013-07-24 NOTE — Telephone Encounter (Signed)
Calling pt back, she had ?? about schedule. LVM for her to call and ask for schedulers.

## 2013-07-26 IMAGING — US US TRANSVAGINAL NON-OB
1 series · 13 of 25 positions shown · non-contrast
Comparison: None.

CLINICAL DATA: Pelvic pain post intercourse.  Evaluate for ovarian
torsion.

TRANSABDOMINAL AND TRANSVAGINAL ULTRASOUND OF PELVIS
DOPPLER ULTRASOUND OF OVARIES
TECHNIQUE: Both transabdominal and transvaginal ultrasound
examinations of the pelvis were performed. Transabdominal technique
was performed for global imaging of the pelvis including uterus,
ovaries, adnexal regions, and pelvic cul-de-sac.
It was necessary to proceed with endovaginal exam following the
transabdominal exam to visualize the .
Color and duplex Doppler ultrasound was utilized to evaluate blood
flow to the ovaries.

[Series 1: us transvaginal non-ob · 0.24mm/px · 13 of 56 slices shown]
[im 1/56]
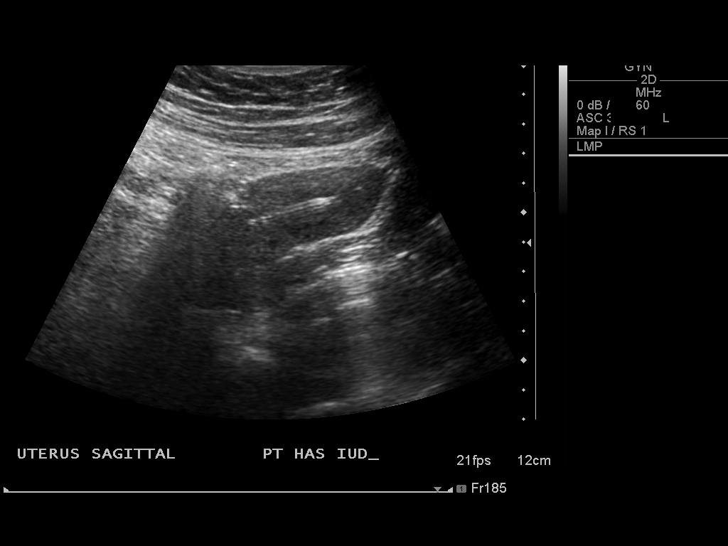
[im 5/56]
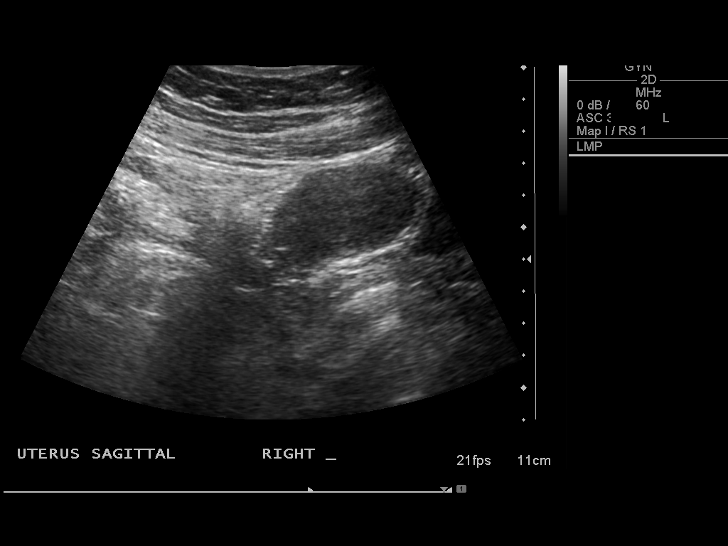
[im 10/56]
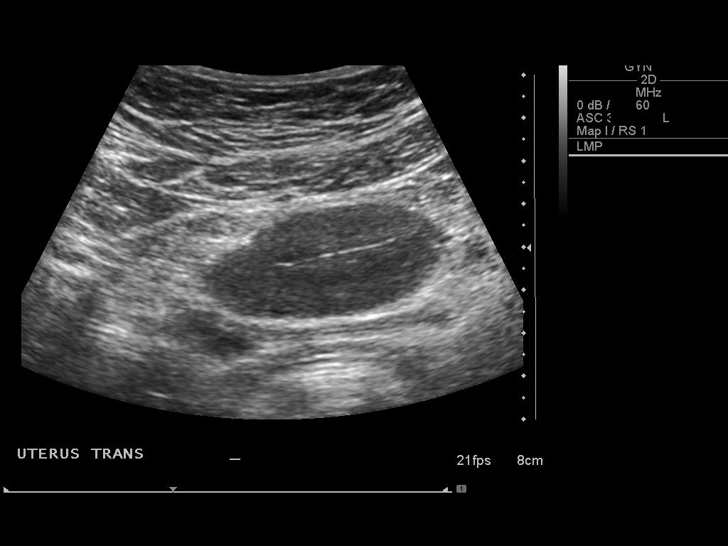
[im 14/56]
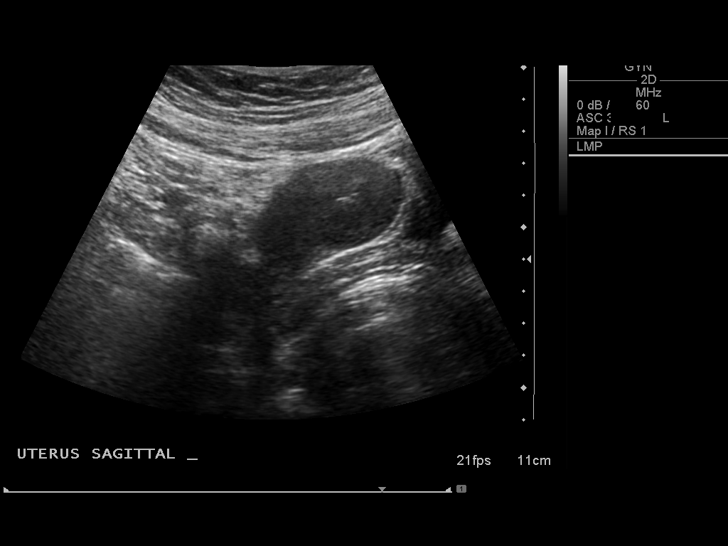
[im 19/56]
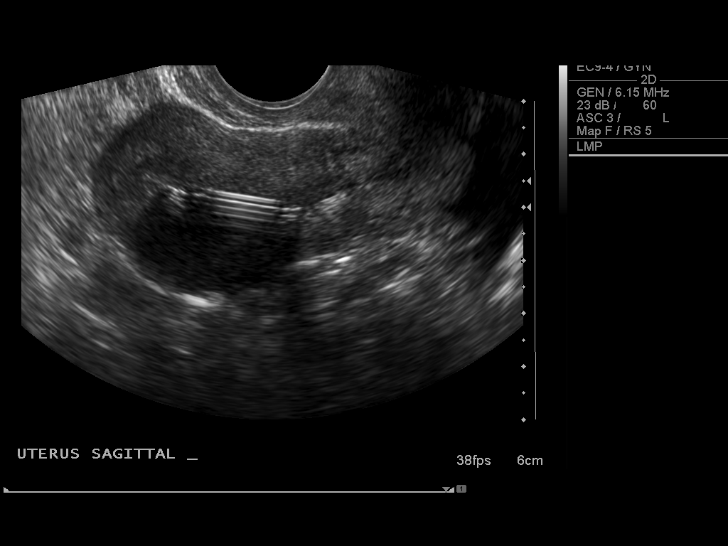
[im 23/56]
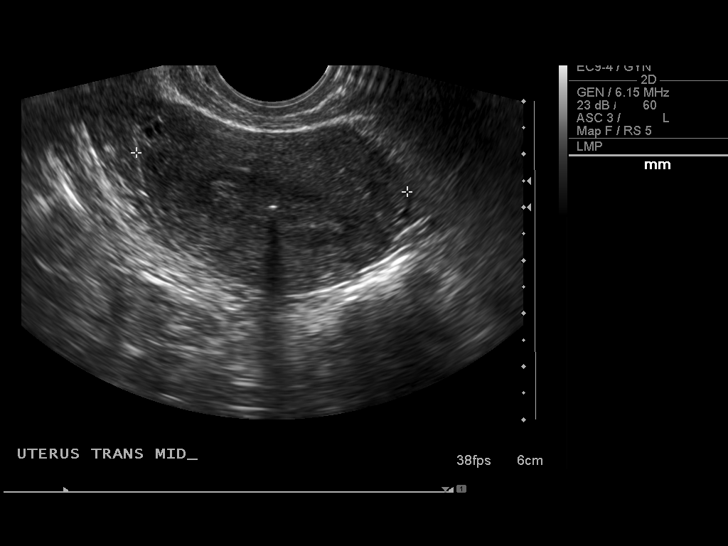
[im 28/56]
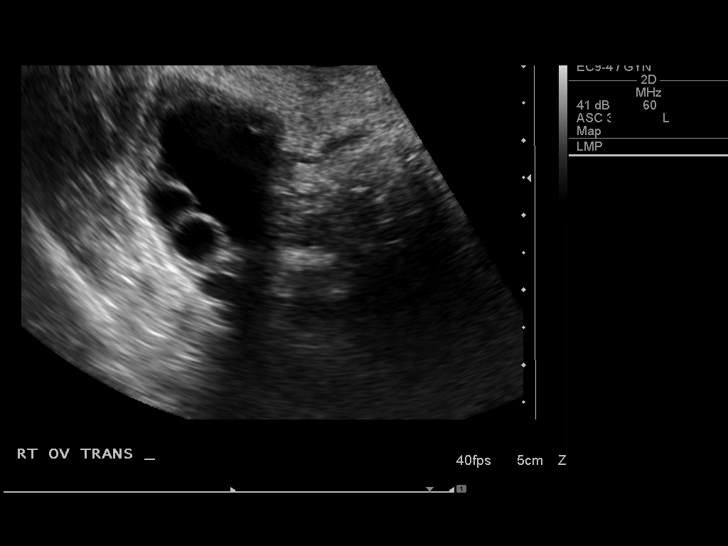
[im 33/56]
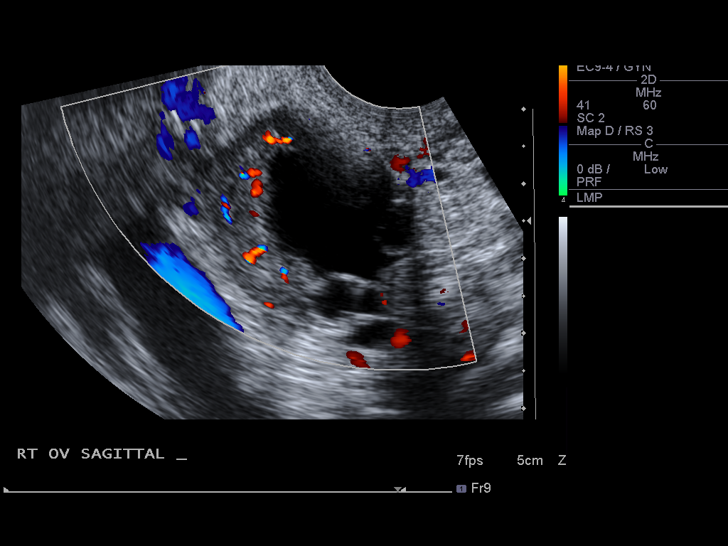
[im 37/56]
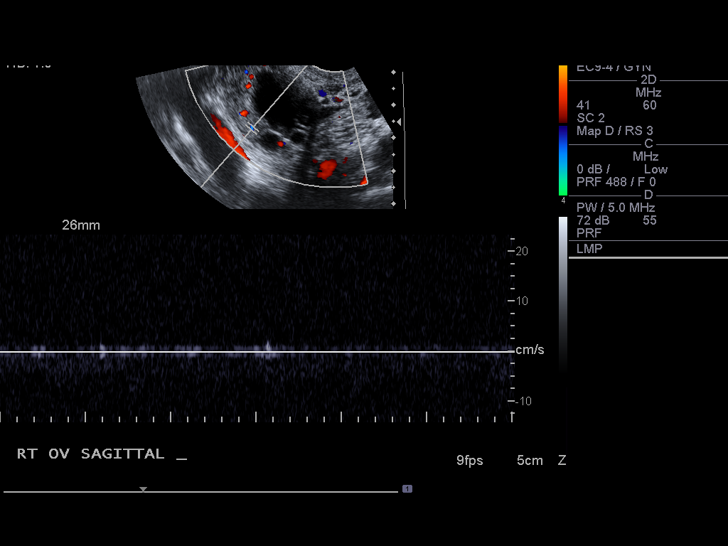
[im 42/56]
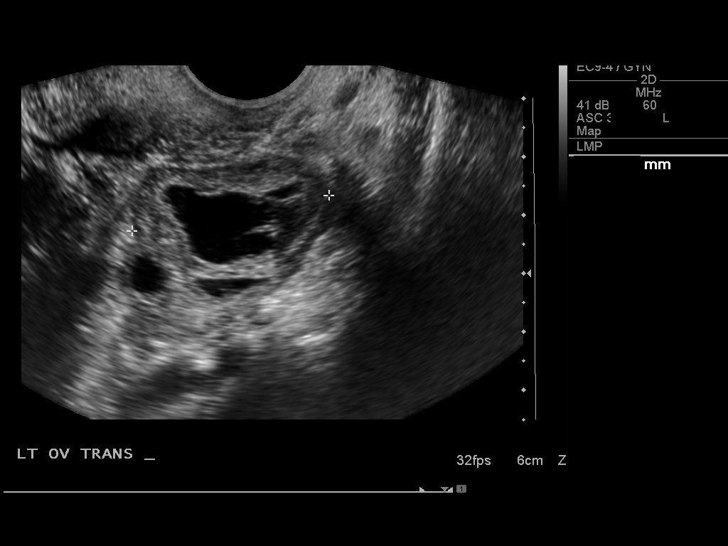
[im 46/56]
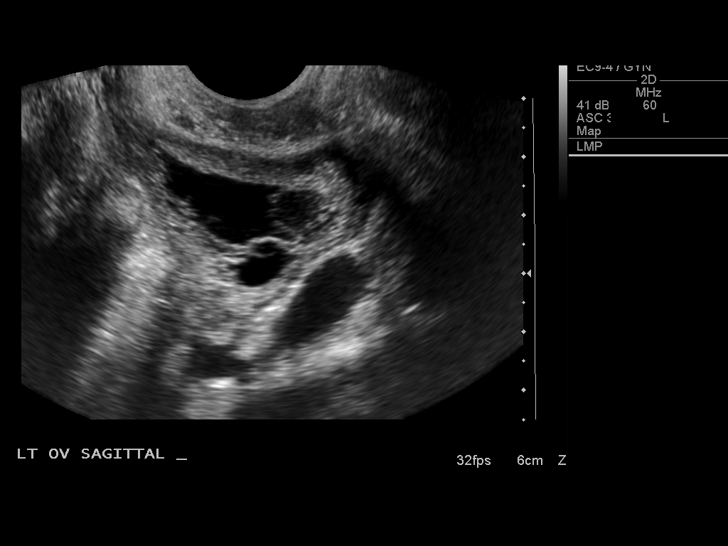
[im 51/56]
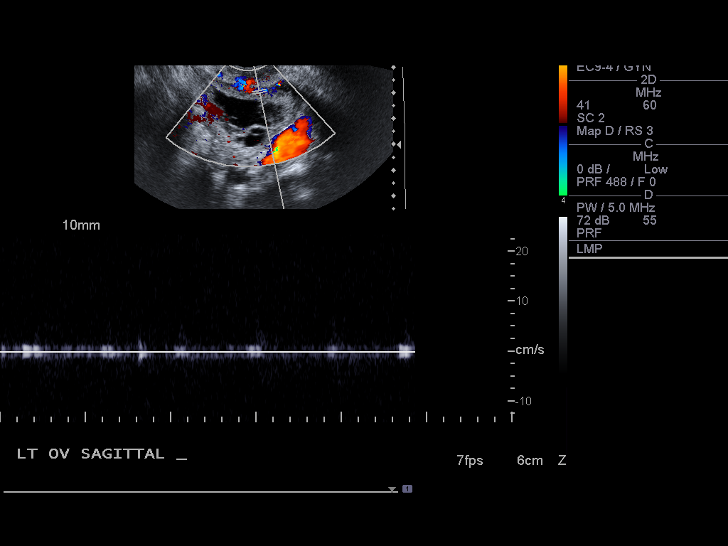
[im 56/56]
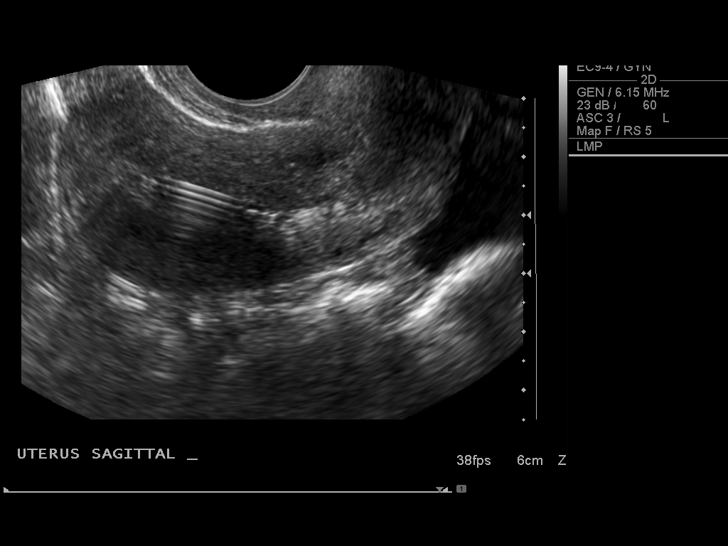

[13 of 25 positions shown; findings below may reference images not displayed]

FINDINGS: Uterus:  There is an IUD present.  Normal anteverted position of
the uterus.  Uterus measures 7.5 x 3.2 x 5.2 cm.

Endometrium:  Endometrium is obscured by the IUD.

Right ovary: Right ovary measures 3.6 x 2.4 x 2.1 cm.  Small
follicles within the right ovary.  Largest follicle measures up to
2.4 cm.  Arterial and venous flow within the right ovary.

Left ovary:   Left ovary measures 3.4 x 4.3 x 3.5 cm.  Small
follicles in the left ovary.  There is an irregular follicle or
involuting cyst in the left ovary that measures up to 2.4 cm.
Arterial and venous flow within the left ovary.

Pulsed Doppler evaluation demonstrates normal low-resistance
arterial and venous waveforms in both ovaries.

Small amount of fluid in the cul-de-sac.
IMPRESSION: Bilateral ovarian follicles.  There is an irregular shaped
hypoechoic structure in the left ovary which could represent an
involuting cyst.  Small amount of free fluid in the pelvis.

Presence of an IUD.

No sonographic evidence for ovarian torsion.

## 2013-08-04 ENCOUNTER — Telehealth: Payer: Self-pay | Admitting: Hematology and Oncology

## 2013-08-04 NOTE — Telephone Encounter (Signed)
Returned pt's call to confirm appts and also moved 10/9 f/u from CP2 to NG. Pt aware of appt d/t's for 9/11 and 10/9. 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

## 2013-08-06 ENCOUNTER — Other Ambulatory Visit: Payer: Self-pay

## 2013-08-06 ENCOUNTER — Telehealth: Payer: Self-pay | Admitting: *Deleted

## 2013-08-06 ENCOUNTER — Other Ambulatory Visit (HOSPITAL_BASED_OUTPATIENT_CLINIC_OR_DEPARTMENT_OTHER): Payer: BC Managed Care – PPO | Admitting: Lab

## 2013-08-06 DIAGNOSIS — D72829 Elevated white blood cell count, unspecified: Secondary | ICD-10-CM

## 2013-08-06 LAB — CBC WITH DIFFERENTIAL/PLATELET
Eosinophils Absolute: 0.3 10*3/uL (ref 0.0–0.5)
MONO#: 0.7 10*3/uL (ref 0.1–0.9)
MONO%: 6.9 % (ref 0.0–14.0)
NEUT#: 6.1 10*3/uL (ref 1.5–6.5)
RBC: 4.33 10*6/uL (ref 3.70–5.45)
RDW: 12.1 % (ref 11.2–14.5)
WBC: 9.7 10*3/uL (ref 3.9–10.3)
lymph#: 2.6 10*3/uL (ref 0.9–3.3)

## 2013-08-06 NOTE — Progress Notes (Signed)
ID: Mia Ortiz OB: Sep 28, 1991  MR#: [id number]  WUJ#:[id number]  [facility]  Telephone:([phone]) [phone] [phone]   OFFICE PROGRESS NOTE  PCP: Leanor Rubenstein, MD   DIAGNOSIS: Leukocytosis   HISTORY OF PRESENT ILLNESS: The patient is a 22 y.o.female who was send to clinic because of leukocytosis. In our system WBC on 08/22/2012 was 12.5 with ANC 7.0.   According to report from PCP office on 11/05/2011 WBC was 13.6; with ANC 11.0; on 06/03/2013 WBC was 13.1, ANC 9.1; on 06/15/2013 WBC was 12.2 with ANC 8.8; and on 07/02/2013 WBC was 13.8 with ANC 9.5. I saw patient first time in clinic today. The patient reported that she feels tired. Her appetite is not good but she gain some weight. She has headache 2-3 times a week. Patient complaints on dyspnea on exertion. One month ago she had episode of palpitation and chest tightness. Occasionally she has  feet numbness. She reported bruises on body, arm, chest. The patient denied fever, chills, night sweats. She denied double vision, blurry vision, nasal congestion, nasal discharge, hearing problems, odynophagia or dysphagia. No cough, abdominal pain, nausea, vomiting, diarrhea,  hematochezia. The patient denied dysuria, nocturia, polyuria, hematuria, myalgia, tingling, psychiatric problems..  Review of Systems  Constitutional: Positive for malaise/fatigue. Negative for fever, chills, weight loss and diaphoresis.  HENT: Negative for hearing loss, ear pain, nosebleeds, congestion, sore throat, neck pain and tinnitus.   Eyes: Negative for blurred vision, double vision, photophobia and pain.  Respiratory: Positive for shortness of breath. Negative for cough, hemoptysis, sputum production and wheezing.   Cardiovascular: Positive for chest pain and palpitations. Negative for orthopnea, claudication and leg swelling.  Gastrointestinal: Positive for constipation. Negative for heartburn, nausea, vomiting, abdominal pain, diarrhea, blood  in stool and melena.  Genitourinary: Negative for dysuria, urgency, frequency and hematuria.  Musculoskeletal: Negative for myalgias, back pain, joint pain and falls.  Skin: Negative for itching and rash.  Neurological: Positive for sensory change and headaches. Negative for dizziness, tingling, tremors, speech change, focal weakness, seizures, loss of consciousness and weakness.  Endo/Heme/Allergies: Bruises/bleeds easily.  Psychiatric/Behavioral: Negative.     PAST MEDICAL HISTORY: Past Medical History  Diagnosis Date  . Diabetes mellitus     PAST SURGICAL HISTORY: No past surgical history on file.  FAMILY HISTORY No family history on file.  HEALTH MAINTENANCE: History  Substance Use Topics  . Smoking status: Current Every Day Smoker -- 0.50 packs/day    Types: Cigarettes  . Smokeless tobacco: Never Used  . Alcohol Use: No     No Known Allergies  No current outpatient prescriptions on file.   No current facility-administered medications for this visit.    OBJECTIVE: Filed Vitals:   07/23/13 1521  BP: 119/72  Pulse: 89  Temp: 98.4 F (36.9 C)  Resp: 20     Body mass index is 29.41 kg/(m^2).    ECOG FS:0  PHYSICAL EXAMINATION:  HEENT: Sclerae anicteric.  Conjunctivae were pink. Pupils round and reactive bilaterally. Oral mucosa is moist without ulceration or thrush. No occipital, submandibular, cervical, supraclavicular or axillar adenopathy. Lungs: clear to auscultation without wheezes. No rales or rhonchi. Heart: regular rate and rhythm. No murmur, gallop or rubs. Abdomen: soft, non tender. No guarding or rebound tenderness. Bowel sounds are present. No palpable hepatosplenomegaly. MSK: no focal spinal tenderness. Extremities: No clubbing or cyanosis.No calf tenderness to palpitation, no peripheral edema. The patient had grossly intact strength in upper and lower extremities. Skin exam was without ecchymosis, petechiae.  Neuro: non-focal, alert and oriented to  time, person and place, appropriate affect  LAB RESULTS:  CMP     Component Value Date/Time   NA 138 08/22/2012 2330   K 3.4* 08/22/2012 2330   CL 102 08/22/2012 2330   CO2 26 08/22/2012 2330   GLUCOSE 88 08/22/2012 2330   BUN 7 08/22/2012 2330   CREATININE 0.63 08/22/2012 2330   CALCIUM 9.4 08/22/2012 2330   PROT 7.3 08/22/2012 2330   ALBUMIN 4.3 08/22/2012 2330   AST 20 08/22/2012 2330   ALT 19 08/22/2012 2330   ALKPHOS 78 08/22/2012 2330   BILITOT 0.3 08/22/2012 2330   GFRNONAA >90 08/22/2012 2330   GFRAA >90 08/22/2012 2330    STUDIES: No results found.  ASSESSMENT AND PLAN: 1. Leukocytosis with neutrophilia. Most likely secondary to smoking. I recommended to stop smoking.  If she needs assistance we can prescribe medications to help stop smoking We will check CBC in 2 weeks. Follow up in 6 weeks    Myra Rude, MD   07/23/2013 6:58 PM

## 2013-08-06 NOTE — Telephone Encounter (Signed)
Informed pt of WBC wnl today and to keep appts as scheduled in October.  She verbalized understanding.

## 2013-09-02 ENCOUNTER — Other Ambulatory Visit: Payer: Self-pay | Admitting: Hematology and Oncology

## 2013-09-03 ENCOUNTER — Other Ambulatory Visit: Payer: Self-pay | Admitting: Lab

## 2013-09-03 ENCOUNTER — Ambulatory Visit: Payer: Self-pay | Admitting: Hematology and Oncology

## 2013-09-03 ENCOUNTER — Ambulatory Visit: Payer: Self-pay

## 2013-09-29 ENCOUNTER — Telehealth: Payer: Self-pay | Admitting: *Deleted

## 2013-09-29 NOTE — Telephone Encounter (Signed)
Pt left VM states has question about appts.  Called pt back and left VM asking her to return call again to Dr. Maxine Glenn nurse.

## 2013-09-29 NOTE — Telephone Encounter (Signed)
Pt returned call, states WBC done at PCP was up to 15 and she was told to make another appt w/ hematologist.  CBC from [facility] reviewed by Dr. Bertis Ruddy.  She states pt to come in for office visit w/i next one to two weeks. POF sent and pt notified of appt to be made.  She verbalized understanding.

## 2013-09-30 ENCOUNTER — Telehealth: Payer: Self-pay | Admitting: *Deleted

## 2013-09-30 NOTE — Telephone Encounter (Signed)
Lm gv appt for 10/14/13 @ 1:30pm. im also mailing a letter/avs...td

## 2013-10-02 ENCOUNTER — Telehealth: Payer: Self-pay | Admitting: Hematology and Oncology

## 2013-10-02 NOTE — Telephone Encounter (Signed)
returned pt and lvm advising on current appt and to call back if changed need to be made

## 2013-10-14 ENCOUNTER — Encounter: Payer: Self-pay | Admitting: Hematology and Oncology

## 2013-10-14 ENCOUNTER — Ambulatory Visit (HOSPITAL_BASED_OUTPATIENT_CLINIC_OR_DEPARTMENT_OTHER): Payer: Managed Care, Other (non HMO) | Admitting: Hematology and Oncology

## 2013-10-14 VITALS — BP 118/66 | HR 87 | Temp 98.2°F | Resp 20 | Ht 66.0 in | Wt 181.7 lb

## 2013-10-14 DIAGNOSIS — D72819 Decreased white blood cell count, unspecified: Secondary | ICD-10-CM

## 2013-10-14 DIAGNOSIS — Z862 Personal history of diseases of the blood and blood-forming organs and certain disorders involving the immune mechanism: Secondary | ICD-10-CM

## 2013-10-14 DIAGNOSIS — F17200 Nicotine dependence, unspecified, uncomplicated: Secondary | ICD-10-CM

## 2013-10-14 HISTORY — DX: Decreased white blood cell count, unspecified: D72.819

## 2013-10-14 NOTE — Progress Notes (Signed)
[facility] OFFICE PROGRESS NOTE  Mia Rubenstein, MD DIAGNOSIS:  Leukocytosis, resolved  SUMMARY OF HEMATOLOGIC HISTORY: This patient was referred here because of abnormal CBC. INTERVAL HISTORY: Mia Ortiz 22 y.o. female returns for further followup. She was told to quit smoking recently. The patient did not quit. She denies any history of recurrent infection.  I have reviewed the past medical history, past surgical history, social history and family history with the patient and they are unchanged from previous note.  ALLERGIES:  has No Known Allergies.  MEDICATIONS:  No current outpatient prescriptions on file.   No current facility-administered medications for this visit.     REVIEW OF SYSTEMS:   Constitutional: Denies fevers, chills or night sweats All other systems were reviewed with the patient and are negative.  PHYSICAL EXAMINATION: ECOG PERFORMANCE STATUS: 0 - Asymptomatic  Filed Vitals:   10/14/13 1334  BP: 118/66  Pulse: 87  Temp: 98.2 F (36.8 C)  Resp: 20   Filed Weights   10/14/13 1334  Weight: 181 lb 11.2 oz (82.419 kg)    GENERAL:alert, no distress and comfortable NEURO: alert & oriented x 3 with fluent speech, no focal motor/sensory deficits  LABORATORY DATA:  I have reviewed the data as listed No results found for this or any previous visit (from the past 48 hour(s)).  Lab Results  Component Value Date   WBC 9.7 08/06/2013   HGB 13.0 08/06/2013   HCT 38.3 08/06/2013   MCV 88.4 08/06/2013   PLT 249 08/06/2013    ASSESSMENT & PLAN:  #1 leukocytosis, resolved Suspect she may have mild, low-grade infection when her blood work was drawn. It resolved spontaneously. No further workup is needed. I reassured the patient. #2 cigarette smoking I spent some time educating the patient the importance of nicotine cessation. She does not appear to be interested to quit smoking. #3 preventive care I recommend influenza vaccination today.  She declined. All questions were answered. The patient knows to call the clinic with any problems, questions or concerns. No barriers to learning was detected.  I spent 15 minutes counseling the patient face to face. The total time spent in the appointment was 20 minutes and more than 50% was on counseling.     Mia Steele, MD 10/14/2013 1:51 PM

## 2014-10-04 ENCOUNTER — Other Ambulatory Visit (HOSPITAL_COMMUNITY)
Admission: RE | Admit: 2014-10-04 | Discharge: 2014-10-04 | Disposition: A | Discharge status: Home / Self Care | Payer: BC Managed Care – PPO | Source: Ambulatory Visit | Attending: Nurse Practitioner | Admitting: Nurse Practitioner

## 2014-10-04 ENCOUNTER — Other Ambulatory Visit: Payer: Self-pay | Admitting: Nurse Practitioner

## 2014-10-04 DIAGNOSIS — Z113 Encounter for screening for infections with a predominantly sexual mode of transmission: Secondary | ICD-10-CM | POA: Insufficient documentation

## 2014-10-04 DIAGNOSIS — Z01419 Encounter for gynecological examination (general) (routine) without abnormal findings: Secondary | ICD-10-CM | POA: Diagnosis present

## 2014-10-05 LAB — CYTOLOGY - PAP

## 2016-05-16 LAB — LIPID PANEL

## 2016-05-16 LAB — BASIC METABOLIC PANEL

## 2016-05-16 LAB — MICROALBUMIN / CREATININE URINE RATIO

## 2016-05-16 LAB — HEMOGLOBIN A1C

## 2016-09-11 ENCOUNTER — Ambulatory Visit: Payer: Self-pay | Admitting: Neurology

## 2017-10-22 ENCOUNTER — Other Ambulatory Visit: Payer: Self-pay | Admitting: Nurse Practitioner

## 2017-10-22 ENCOUNTER — Other Ambulatory Visit (HOSPITAL_COMMUNITY)
Admission: RE | Admit: 2017-10-22 | Discharge: 2017-10-22 | Disposition: A | Discharge status: Home / Self Care | Payer: PRIVATE HEALTH INSURANCE | Source: Ambulatory Visit | Attending: Nurse Practitioner | Admitting: Nurse Practitioner

## 2017-10-22 DIAGNOSIS — Z01419 Encounter for gynecological examination (general) (routine) without abnormal findings: Secondary | ICD-10-CM | POA: Insufficient documentation

## 2017-10-23 LAB — CYTOLOGY - PAP
Chlamydia: NEGATIVE
DIAGNOSIS: NEGATIVE
Neisseria Gonorrhea: NEGATIVE
Trichomonas: NEGATIVE

## 2020-02-18 DIAGNOSIS — Z01419 Encounter for gynecological examination (general) (routine) without abnormal findings: Secondary | ICD-10-CM | POA: Diagnosis not present

## 2020-03-04 DIAGNOSIS — F4323 Adjustment disorder with mixed anxiety and depressed mood: Secondary | ICD-10-CM | POA: Diagnosis not present

## 2020-06-30 DIAGNOSIS — Z0000 Encounter for general adult medical examination without abnormal findings: Secondary | ICD-10-CM | POA: Diagnosis not present

## 2020-06-30 DIAGNOSIS — Z1159 Encounter for screening for other viral diseases: Secondary | ICD-10-CM | POA: Diagnosis not present

## 2020-06-30 DIAGNOSIS — Z13220 Encounter for screening for lipoid disorders: Secondary | ICD-10-CM | POA: Diagnosis not present

## 2020-08-16 DIAGNOSIS — Z20822 Contact with and (suspected) exposure to covid-19: Secondary | ICD-10-CM | POA: Diagnosis not present

## 2020-08-16 DIAGNOSIS — B349 Viral infection, unspecified: Secondary | ICD-10-CM | POA: Diagnosis not present

## 2020-08-16 DIAGNOSIS — J029 Acute pharyngitis, unspecified: Secondary | ICD-10-CM | POA: Diagnosis not present

## 2020-08-16 DIAGNOSIS — R0981 Nasal congestion: Secondary | ICD-10-CM | POA: Diagnosis not present

## 2020-08-16 DIAGNOSIS — Z03818 Encounter for observation for suspected exposure to other biological agents ruled out: Secondary | ICD-10-CM | POA: Diagnosis not present

## 2020-11-21 DIAGNOSIS — J988 Other specified respiratory disorders: Secondary | ICD-10-CM | POA: Diagnosis not present

## 2020-11-21 DIAGNOSIS — J029 Acute pharyngitis, unspecified: Secondary | ICD-10-CM | POA: Diagnosis not present

## 2020-11-21 DIAGNOSIS — R519 Headache, unspecified: Secondary | ICD-10-CM | POA: Diagnosis not present

## 2020-11-21 DIAGNOSIS — Z20822 Contact with and (suspected) exposure to covid-19: Secondary | ICD-10-CM | POA: Diagnosis not present

## 2020-12-13 DIAGNOSIS — Z20822 Contact with and (suspected) exposure to covid-19: Secondary | ICD-10-CM | POA: Diagnosis not present

## 2020-12-13 DIAGNOSIS — J988 Other specified respiratory disorders: Secondary | ICD-10-CM | POA: Diagnosis not present

## 2021-02-08 DIAGNOSIS — Z6835 Body mass index (BMI) 35.0-35.9, adult: Secondary | ICD-10-CM | POA: Diagnosis not present

## 2021-02-08 DIAGNOSIS — Z01419 Encounter for gynecological examination (general) (routine) without abnormal findings: Secondary | ICD-10-CM | POA: Diagnosis not present

## 2021-02-14 LAB — HM PAP SMEAR: HPV, high-risk: NEGATIVE

## 2021-02-21 DIAGNOSIS — F4323 Adjustment disorder with mixed anxiety and depressed mood: Secondary | ICD-10-CM | POA: Diagnosis not present

## 2021-03-21 DIAGNOSIS — F4323 Adjustment disorder with mixed anxiety and depressed mood: Secondary | ICD-10-CM | POA: Diagnosis not present

## 2021-04-17 DIAGNOSIS — J309 Allergic rhinitis, unspecified: Secondary | ICD-10-CM | POA: Diagnosis not present

## 2021-06-13 ENCOUNTER — Encounter: Payer: Self-pay | Admitting: Allergy

## 2021-06-13 ENCOUNTER — Other Ambulatory Visit: Payer: Self-pay

## 2021-06-13 ENCOUNTER — Ambulatory Visit: Payer: BC Managed Care – PPO | Admitting: Allergy

## 2021-06-13 VITALS — BP 120/76 | HR 87 | Temp 96.5°F | Resp 18 | Ht 65.5 in | Wt 215.0 lb

## 2021-06-13 DIAGNOSIS — J3089 Other allergic rhinitis: Secondary | ICD-10-CM | POA: Insufficient documentation

## 2021-06-13 DIAGNOSIS — J302 Other seasonal allergic rhinitis: Secondary | ICD-10-CM | POA: Insufficient documentation

## 2021-06-13 DIAGNOSIS — H1013 Acute atopic conjunctivitis, bilateral: Secondary | ICD-10-CM | POA: Diagnosis not present

## 2021-06-13 MED ORDER — MONTELUKAST SODIUM 10 MG PO TABS: 10.0000 mg | ORAL_TABLET | Freq: Every day | ORAL | 5 refills | Status: DC

## 2021-06-13 MED ORDER — OLOPATADINE HCL 0.2 % OP SOLN: 1.0000 [drp] | Freq: Every day | OPHTHALMIC | 5 refills | Status: DC | PRN

## 2021-06-13 NOTE — Progress Notes (Signed)
New Patient Note  RE: Mia KiefBrooke L Ortiz MRN: [id number] DOB: 1991-08-27 Date of Office Visit: 06/13/2021  Consult requested by: Janace ArisBast, Traci A, NP Primary care provider: Deatra JamesSun, Vyvyan, MD  Chief Complaint: Allergic Rhinitis  (Sneezing, slight coughing, post nasal drips, itchy watery eyes., slight itchy throat.), Nasal Congestion, and Sinus Problem (Pressure.)  History of Present Illness: I had the pleasure of seeing Mia NevinBrooke Ortiz for initial evaluation at the [facility] on 06/13/2021. She is a 30 y.o. female, who is referred here by Deatra JamesSun, Vyvyan, MD for the evaluation of allergic rhinitis.  She reports symptoms of sneezing, PND, coughing, rhinorrhea, nasal congestion, sinus pressure, itchy throat, itchy/watery eyes. Symptoms have been going on for many years and worsening the past 3-4 years. The symptoms are present all year around with worsening in spring. Other triggers include exposure to unknown. Anosmia: no. Headache: yes. She has used Claritin, zyrtec, benadryl, allegra with some improvement in symptoms. Patient does not like to take nasal sprays. Sinus infections: yes. Previous work up includes: none. Previous ENT evaluation: yes for tonsils due to enlarged tonsils. Previous sinus imaging: no. History of nasal polyps: no. Last eye exam: over 1 year ago. History of reflux: no.  Assessment and Plan: Mia SettleBrooke is a 30 y.o. female with: Other allergic rhinitis Perennial rhinoconjunctivitis symptoms with worsening in the spring.  Tried Claritin, Zyrtec, Benadryl and Allegra with some benefit.  Does not like to use nasal sprays.  Patient interested in getting a dog in the future. Today's skin testing showed: Positive to grass, cat, dog.  Start environmental control measures as below. Use over the counter antihistamines such as Zyrtec (cetirizine), Claritin (loratadine), Allegra (fexofenadine), or Xyzal (levocetirizine) daily as needed. May take twice a day during allergy  flares. May switch antihistamines every few months. Use olopatadine eye drops 0.2 % once a day as needed for itchy/watery eyes. Start Singulair (montelukast) 10mg  daily at night. Cautioned that in some children/adults can experience behavioral changes including hyperactivity, agitation, depression, sleep disturbances and suicidal ideations. These side effects are rare, but if you notice them you should notify me and discontinue Singulair (montelukast). Consider allergy injections for long term control if above medications do not help the symptoms - handout given.  Let us know when ready to start.   Allergic conjunctivitis of both eyes See assessment and plan as above.  Return in about 6 months (around 12/14/2021).  Meds ordered this encounter  Medications   Olopatadine HCl 0.2 % SOLN    Sig: Apply 1 drop to eye daily as needed (itchy/watery eyes).    Dispense:  2.5 mL    Refill:  5   montelukast (SINGULAIR) 10 MG tablet    Sig: Take 1 tablet (10 mg total) by mouth at bedtime.    Dispense:  30 tablet    Refill:  5    Lab Orders  No laboratory test(s) ordered today    Other allergy screening: Asthma: no Food allergy: no Medication allergy: no Hymenoptera allergy: no Urticaria: no Eczema:no History of recurrent infections suggestive of immunodeficency: no  Diagnostics: Skin Testing: Environmental allergy panel. Positive to grass, cat, dog.  Results discussed with patient/family.  Airborne Adult Perc - 06/13/21 0931     Time Antigen Placed 0931    Allergen Manufacturer Waynette ButteryGreer    Location Back    Number of Test 59    1. Control-Buffer 50% Glycerol Negative    2. Control-Histamine 1 mg/ml 2+    3.  Albumin saline Negative    4. Bahia Negative    5. French Southern Territories Negative    6. Johnson Negative    7. Kentucky Blue 2+    8. Meadow Fescue 3+    9. Perennial Rye 3+    10. Sweet Vernal 3+    11. Timothy Negative    12. Cocklebur Negative    13. Burweed Marshelder Negative     14. Ragweed, short Negative    15. Ragweed, Giant Negative    16. Plantain,  English Negative    17. Lamb's Quarters Negative    18. Sheep Sorrell Negative    19. Rough Pigweed Negative    20. Marsh Elder, Rough Negative    21. Mugwort, Common Negative    22. Ash mix Negative    23. Birch mix Negative    24. Beech American Negative    25. Box, Elder Negative    26. Cedar, red Negative    27. Cottonwood, Guinea-Bissau Negative    28. Elm mix Negative    29. Hickory Negative    30. Maple mix Negative    31. Oak, Guinea-Bissau mix Negative    32. Pecan Pollen Negative    33. Pine mix Negative    34. Sycamore Eastern Negative    35. Walnut, Black Pollen Negative    36. Alternaria alternata Negative    37. Cladosporium Herbarum Negative    38. Aspergillus mix Negative    39. Penicillium mix Negative    40. Bipolaris sorokiniana (Helminthosporium) Negative    41. Drechslera spicifera (Curvularia) Negative    42. Mucor plumbeus Negative    43. Fusarium moniliforme Negative    44. Aureobasidium pullulans (pullulara) Negative    45. Rhizopus oryzae Negative    46. Botrytis cinera Negative    47. Epicoccum nigrum Negative    48. Phoma betae Negative    49. Candida Albicans Negative    50. Trichophyton mentagrophytes Negative    51. Mite, D Farinae  5,000 AU/ml Negative    52. Mite, D Pteronyssinus  5,000 AU/ml Negative    53. Cat Hair 10,000 BAU/ml 3+    54.  Dog Epithelia 3+    55. Mixed Feathers Negative    56. Horse Epithelia Negative    57. Cockroach, German Negative    58. Mouse Negative    59. Tobacco Leaf Negative             Intradermal - 06/13/21 1021     Time Antigen Placed 1021    Allergen Manufacturer Waynette Buttery    Location Arm    Number of Test 12    Control Negative    French [place] Territ[place] 2+    Johnson Negative    Ragweed mix Negative    Weed mix Negative    Tree mix Negative    Mold 1 Negative    Mold 2 Negative    Mold 3 Negative    Mold 4 Negative    Cockroach Negative     Mite mix Negative             Past Medical History: Patient Active Problem List   Diagnosis Date Noted   Other allergic rhinitis 06/13/2021   Allergic conjunctivitis of both eyes 06/13/2021   Leukopenia 10/14/2013   Past Medical History:  Diagnosis Date   Diabetes mellitus    Leukopenia 10/14/2013   Past Surgical History: History reviewed. No pertinent surgical history. Medication List:  Current Outpatient Medications  Medication Sig Dispense Refill  diphenhydrAMINE (BENADRYL) 25 MG tablet Take 25 mg by mouth at bedtime.     montelukast (SINGULAIR) 10 MG tablet Take 1 tablet (10 mg total) by mouth at bedtime. 30 tablet 5   Olopatadine HCl 0.2 % SOLN Apply 1 drop to eye daily as needed (itchy/watery eyes). 2.5 mL 5   buPROPion (WELLBUTRIN XL) 300 MG 24 hr tablet Take 300 mg by mouth daily.     escitalopram (LEXAPRO) 10 MG tablet Take 10 mg by mouth daily.     etonogestrel-ethinyl estradiol (NUVARING) 0.12-0.015 MG/24HR vaginal ring NuvaRing 0.12 mg-0.015 mg/24 hr vaginal  Insert 1 vaginal ring by vaginal route for 21 days.     loratadine (CLARITIN) 10 MG tablet Claritin 10 mg tablet  Take 1 tablet every day by oral route.     No current facility-administered medications for this visit.   Allergies: No Known Allergies Social History: Social History   Socioeconomic History   Marital status: Single    Spouse name: Not on file   Number of children: Not on file   Years of education: Not on file   Highest education level: Not on file  Occupational History   Not on file  Tobacco Use   Smoking status: Every Day    Packs/day: 0.50    Years: 3.00    Pack years: 1.50    Types: Cigarettes   Smokeless tobacco: Never  Substance and Sexual Activity   Alcohol use: No   Drug use: No   Sexual activity: Not on file  Other Topics Concern   Not on file  Social History Narrative   Not on file   Social Determinants of Health   Financial Resource Strain: Not on file   Food Insecurity: Not on file  Transportation Needs: Not on file  Physical Activity: Not on file  Stress: Not on file  Social Connections: Not on file   Lives in a 39+ year old apartment. Smoking: 3 years - vapes Occupation: patient care coordinator  Environmental History: Water Damage/mildew in the house: no Carpet in the family room: yes Carpet in the bedroom: yes Heating: electric Cooling: central Pet: no  Family History: Family History  Problem Relation Age of Onset   Cancer Paternal Uncle        lung cancer   Cancer Maternal Grandmother        cervical cancer   Cancer Maternal Grandfather        stomach ca   Problem                               Relation Asthma                                   Brother  Eczema                                No  Food allergy                          No  Allergic rhino conjunctivitis     No   Review of Systems  Constitutional:  Negative for appetite change, chills, fever and unexpected weight change.  HENT:  Positive for congestion, postnasal drip, rhinorrhea, sinus pressure and sneezing.   Eyes:  Positive for  itching.  Respiratory:  Positive for cough. Negative for chest tightness, shortness of breath and wheezing.   Cardiovascular:  Negative for chest pain.  Gastrointestinal:  Positive for constipation. Negative for abdominal pain.  Genitourinary:  Negative for difficulty urinating.  Skin:  Negative for rash.  Allergic/Immunologic: Positive for environmental allergies.  Neurological:  Positive for headaches.   Objective: BP 120/76 (BP Location: Left Arm, Patient Position: Sitting, Cuff Size: Normal)   Pulse 87   Temp (!) 96.5 F (35.8 C) (Temporal)   Resp 18   Ht 5' 5.5" (1.664 m)   Wt 215 lb (97.5 kg)   SpO2 98%   BMI 35.23 kg/m  Body mass index is 35.23 kg/m. Physical Exam Vitals and nursing note reviewed.  Constitutional:      Appearance: Normal appearance. She is well-developed.  HENT:     Head: Normocephalic  and atraumatic.     Right Ear: Tympanic membrane and external ear normal.     Left Ear: Tympanic membrane and external ear normal.     Nose: Nose normal.     Mouth/Throat:     Mouth: Mucous membranes are moist.     Pharynx: Oropharynx is clear.  Eyes:     Conjunctiva/sclera: Conjunctivae normal.  Cardiovascular:     Rate and Rhythm: Normal rate and regular rhythm.     Heart sounds: Normal heart sounds. No murmur heard.   No friction rub. No gallop.  Pulmonary:     Effort: Pulmonary effort is normal.     Breath sounds: Normal breath sounds. No wheezing, rhonchi or rales.  Musculoskeletal:     Cervical back: Neck supple.  Skin:    General: Skin is warm.     Findings: No rash.  Neurological:     Mental Status: She is alert and oriented to person, place, and time.  Psychiatric:        Behavior: Behavior normal.   The plan was reviewed with the patient/family, and all questions/concerned were addressed.  It was my pleasure to see Skylen today and participate in her care. Please feel free to contact me with any questions or concerns.  Sincerely,  Wyline Mood, DO Allergy & Immunology  [facility] office: [phone] [facility] office: [phone]

## 2021-06-13 NOTE — Patient Instructions (Addendum)
Today's skin testing showed: Positive to grass, cat, dog.   Environmental allergies Start environmental control measures as below. Use over the counter antihistamines such as Zyrtec (cetirizine), Claritin (loratadine), Allegra (fexofenadine), or Xyzal (levocetirizine) daily as needed. May take twice a day during allergy flares. May switch antihistamines every few months. Use olopatadine eye drops 0.2 % once a day as needed for itchy/watery eyes. Start Singulair (montelukast) 10mg  daily at night. Cautioned that in some children/adults can experience behavioral changes including hyperactivity, agitation, depression, sleep disturbances and suicidal ideations. These side effects are rare, but if you notice them you should notify me and discontinue Singulair (montelukast).  Consider allergy injections for long term control if above medications do not help the symptoms - handout given.  Let know when ready to start.   Follow up in 6 months or sooner if needed.   Reducing Pollen Exposure Pollen seasons: trees (spring), grass (summer) and ragweed/weeds (fall). Keep windows closed in your home and car to lower pollen exposure.  Install air conditioning in the bedroom and throughout the house if possible.  Avoid going out in dry windy days - especially early morning. Pollen counts are highest between 5 - 10 AM and on dry, hot and windy days.  Save outside activities for late afternoon or after a heavy rain, when pollen levels are lower.  Avoid mowing of grass if you have grass pollen allergy. Be aware that pollen can also be transported indoors on people and pets.  Dry your clothes in an automatic dryer rather than hanging them outside where they might collect pollen.  Rinse hair and eyes before bedtime. Pet Allergen Avoidance: Contrary to popular opinion, there are no "hypoallergenic" breeds of dogs or cats. That is because people are not allergic to an animal's hair, but to an allergen found in  the animal's saliva, dander (dead skin flakes) or urine. Pet allergy symptoms typically occur within minutes. For some people, symptoms can build up and become most severe 8 to 12 hours after contact with the animal. People with severe allergies can experience reactions in public places if dander has been transported on the pet owners' clothing. Keeping an animal outdoors is only a partial solution, since homes with pets in the yard still have higher concentrations of animal allergens. Before getting a pet, ask your allergist to determine if you are allergic to animals. If your pet is already considered part of your family, try to minimize contact and keep the pet out of the bedroom and other rooms where you spend a great deal of time. As with dust mites, vacuum carpets often or replace carpet with a hardwood floor, tile or linoleum. High-efficiency particulate air (HEPA) cleaners can reduce allergen levels over time. While dander and saliva are the source of cat and dog allergens, urine is the source of allergens from rabbits, hamsters, mice and 04-04-1982 pigs; so ask a non-allergic family member to clean the animal's cage. If you have a pet allergy, talk to your allergist about the potential for allergy immunotherapy (allergy shots). This strategy can often provide long-term relief.

## 2021-06-13 NOTE — Assessment & Plan Note (Signed)
.   See assessment and plan as above. 

## 2021-06-13 NOTE — Assessment & Plan Note (Signed)
Perennial rhinoconjunctivitis symptoms with worsening in the spring.  Tried Claritin, Zyrtec, Benadryl and Allegra with some benefit.  Does not like to use nasal sprays.  Patient interested in getting a dog in the future.  Today's skin testing showed: Positive to grass, cat, dog.   Start environmental control measures as below.  Use over the counter antihistamines such as Zyrtec (cetirizine), Claritin (loratadine), Allegra (fexofenadine), or Xyzal (levocetirizine) daily as needed. May take twice a day during allergy flares. May switch antihistamines every few months.  Use olopatadine eye drops 0.2 % once a day as needed for itchy/watery eyes.  Start Singulair (montelukast) 10mg  daily at night.  Cautioned that in some children/adults can experience behavioral changes including hyperactivity, agitation, depression, sleep disturbances and suicidal ideations. These side effects are rare, but if you notice them you should notify me and discontinue Singulair (montelukast).  Consider allergy injections for long term control if above medications do not help the symptoms - handout given.   Let know when ready to start.

## 2021-06-19 ENCOUNTER — Telehealth: Payer: Self-pay

## 2021-06-19 ENCOUNTER — Telehealth: Payer: Self-pay | Admitting: Allergy

## 2021-06-19 MED ORDER — TRIAMCINOLONE ACETONIDE 0.1 % EX OINT: 1.0000 "application " | TOPICAL_OINTMENT | Freq: Two times a day (BID) | CUTANEOUS | 0 refills | Status: DC | PRN

## 2021-06-19 NOTE — Telephone Encounter (Signed)
Please call patient.  The medications I prescribed, should not be causing her to get vomiting and dizzy. Sometimes the antihistamines can cause some fatigue as they cause drowsiness.  If she's having the above symptoms, then I recommend that she goes to her PCP or Urgent care for further evaluation.

## 2021-06-19 NOTE — Telephone Encounter (Signed)
Please call patient.  Take picture.   Use triamcinolone 0.1% ointment twice a day as needed for rash. Do not use on the face, neck, armpits or groin area. Do not use more than 3 weeks in a row.   Use over the counter antihistamines such as Zyrtec (cetirizine), Claritin (loratadine), Allegra (fexofenadine), or Xyzal (levocetirizine) daily as needed. May take twice a day

## 2021-06-19 NOTE — Telephone Encounter (Signed)
Patient called stating she still had welts in her right arm where she was tested at last visit. Patient is still itching where the welts are. Patient has been taking allegra, she last took some around 12 pm today. Patient did start taking singulair but had to stop due to experiencing hallucinations. Patient is wondering if anything else could be sent in for her or if she should continue using benadryl. Patient is also wondering what she could do about the welts in her arm.   If anything is sent in for patient, patient would like it sent to [facility].   Best contact number: [phone]

## 2021-06-19 NOTE — Telephone Encounter (Signed)
Patient called stating that she recently vomited 3 times, has dizziness, and fatigue. She has called someone to pick her up from work, but has not gotten any response. So she has not gone to urgent care or an emergency room for evaluation.She does not have anyone at work to take her and sounded hesitant on calling 911 for an ambulance. She took Allegra, but has not seen any change in symptoms. I did read over note for rash. Please advise.

## 2021-10-26 DIAGNOSIS — F329 Major depressive disorder, single episode, unspecified: Secondary | ICD-10-CM | POA: Diagnosis not present

## 2021-11-02 DIAGNOSIS — F4323 Adjustment disorder with mixed anxiety and depressed mood: Secondary | ICD-10-CM | POA: Diagnosis not present

## 2021-11-09 DIAGNOSIS — F4323 Adjustment disorder with mixed anxiety and depressed mood: Secondary | ICD-10-CM | POA: Diagnosis not present

## 2021-11-17 DIAGNOSIS — F329 Major depressive disorder, single episode, unspecified: Secondary | ICD-10-CM | POA: Diagnosis not present

## 2021-11-30 DIAGNOSIS — F329 Major depressive disorder, single episode, unspecified: Secondary | ICD-10-CM | POA: Diagnosis not present

## 2021-12-07 DIAGNOSIS — F329 Major depressive disorder, single episode, unspecified: Secondary | ICD-10-CM | POA: Diagnosis not present

## 2021-12-14 ENCOUNTER — Ambulatory Visit: Payer: BC Managed Care – PPO | Admitting: Allergy

## 2021-12-14 DIAGNOSIS — F329 Major depressive disorder, single episode, unspecified: Secondary | ICD-10-CM | POA: Diagnosis not present

## 2021-12-21 DIAGNOSIS — F329 Major depressive disorder, single episode, unspecified: Secondary | ICD-10-CM | POA: Diagnosis not present

## 2021-12-28 DIAGNOSIS — F329 Major depressive disorder, single episode, unspecified: Secondary | ICD-10-CM | POA: Diagnosis not present

## 2022-01-04 DIAGNOSIS — F329 Major depressive disorder, single episode, unspecified: Secondary | ICD-10-CM | POA: Diagnosis not present

## 2022-01-11 DIAGNOSIS — F329 Major depressive disorder, single episode, unspecified: Secondary | ICD-10-CM | POA: Diagnosis not present

## 2022-01-16 DIAGNOSIS — Z79899 Other long term (current) drug therapy: Secondary | ICD-10-CM | POA: Diagnosis not present

## 2022-01-16 DIAGNOSIS — E7800 Pure hypercholesterolemia, unspecified: Secondary | ICD-10-CM | POA: Diagnosis not present

## 2022-01-16 DIAGNOSIS — F4323 Adjustment disorder with mixed anxiety and depressed mood: Secondary | ICD-10-CM | POA: Diagnosis not present

## 2022-01-16 DIAGNOSIS — Z23 Encounter for immunization: Secondary | ICD-10-CM | POA: Diagnosis not present

## 2022-01-16 DIAGNOSIS — E669 Obesity, unspecified: Secondary | ICD-10-CM | POA: Diagnosis not present

## 2022-01-18 DIAGNOSIS — F329 Major depressive disorder, single episode, unspecified: Secondary | ICD-10-CM | POA: Diagnosis not present

## 2022-01-25 DIAGNOSIS — F329 Major depressive disorder, single episode, unspecified: Secondary | ICD-10-CM | POA: Diagnosis not present

## 2022-01-27 ENCOUNTER — Emergency Department (HOSPITAL_BASED_OUTPATIENT_CLINIC_OR_DEPARTMENT_OTHER): Payer: BC Managed Care – PPO

## 2022-01-27 ENCOUNTER — Encounter (HOSPITAL_COMMUNITY)
Admission: EM | Disposition: A | Discharge status: Home / Self Care | Payer: Self-pay | Source: Home / Self Care | Attending: Emergency Medicine

## 2022-01-27 ENCOUNTER — Encounter (HOSPITAL_BASED_OUTPATIENT_CLINIC_OR_DEPARTMENT_OTHER): Payer: Self-pay | Admitting: Emergency Medicine

## 2022-01-27 ENCOUNTER — Emergency Department (HOSPITAL_COMMUNITY): Payer: BC Managed Care – PPO | Admitting: Anesthesiology

## 2022-01-27 ENCOUNTER — Other Ambulatory Visit: Payer: Self-pay

## 2022-01-27 ENCOUNTER — Ambulatory Visit (HOSPITAL_BASED_OUTPATIENT_CLINIC_OR_DEPARTMENT_OTHER)
Admission: EM | Admit: 2022-01-27 | Discharge: 2022-01-27 | Disposition: A | Discharge status: Home / Self Care | Payer: BC Managed Care – PPO | Attending: Emergency Medicine | Admitting: Emergency Medicine

## 2022-01-27 DIAGNOSIS — Z87891 Personal history of nicotine dependence: Secondary | ICD-10-CM | POA: Insufficient documentation

## 2022-01-27 DIAGNOSIS — R1011 Right upper quadrant pain: Secondary | ICD-10-CM | POA: Insufficient documentation

## 2022-01-27 DIAGNOSIS — Z20822 Contact with and (suspected) exposure to covid-19: Secondary | ICD-10-CM | POA: Diagnosis not present

## 2022-01-27 DIAGNOSIS — K828 Other specified diseases of gallbladder: Secondary | ICD-10-CM | POA: Diagnosis not present

## 2022-01-27 DIAGNOSIS — K8000 Calculus of gallbladder with acute cholecystitis without obstruction: Secondary | ICD-10-CM | POA: Diagnosis not present

## 2022-01-27 DIAGNOSIS — K8020 Calculus of gallbladder without cholecystitis without obstruction: Secondary | ICD-10-CM

## 2022-01-27 DIAGNOSIS — R109 Unspecified abdominal pain: Secondary | ICD-10-CM

## 2022-01-27 DIAGNOSIS — K8010 Calculus of gallbladder with chronic cholecystitis without obstruction: Secondary | ICD-10-CM | POA: Insufficient documentation

## 2022-01-27 HISTORY — PX: CHOLECYSTECTOMY: SHX55

## 2022-01-27 HISTORY — DX: Prediabetes: R73.03

## 2022-01-27 LAB — CBC
HCT: 41.9 % (ref 36.0–46.0)
Hemoglobin: 13.3 g/dL (ref 12.0–15.0)
MCH: 27.5 pg (ref 26.0–34.0)
MCHC: 31.7 g/dL (ref 30.0–36.0)
MCV: 86.7 fL (ref 80.0–100.0)
Platelets: 324 10*3/uL (ref 150–400)
RBC: 4.83 MIL/uL (ref 3.87–5.11)
RDW: 13.8 % (ref 11.5–15.5)
WBC: 15 10*3/uL — ABNORMAL HIGH (ref 4.0–10.5)
nRBC: 0 % (ref 0.0–0.2)

## 2022-01-27 LAB — PREGNANCY, URINE: Preg Test, Ur: NEGATIVE

## 2022-01-27 LAB — RESP PANEL BY RT-PCR (FLU A&B, COVID) ARPGX2
Influenza A by PCR: NEGATIVE
Influenza B by PCR: NEGATIVE
SARS Coronavirus 2 by RT PCR: NEGATIVE

## 2022-01-27 LAB — COMPREHENSIVE METABOLIC PANEL
ALT: 14 U/L (ref 0–44)
AST: 13 U/L — ABNORMAL LOW (ref 15–41)
Albumin: 4.3 g/dL (ref 3.5–5.0)
Alkaline Phosphatase: 86 U/L (ref 38–126)
Anion gap: 10 (ref 5–15)
BUN: 12 mg/dL (ref 6–20)
CO2: 20 mmol/L — ABNORMAL LOW (ref 22–32)
Calcium: 9.1 mg/dL (ref 8.9–10.3)
Chloride: 105 mmol/L (ref 98–111)
Creatinine, Ser: 0.65 mg/dL (ref 0.44–1.00)
GFR, Estimated: >60 mL/min (ref >60)
Glucose, Bld: 103 mg/dL — ABNORMAL HIGH (ref 70–99)
Potassium: 3.9 mmol/L (ref 3.5–5.1)
Sodium: 135 mmol/L (ref 135–145)
Total Bilirubin: 0.6 mg/dL (ref 0.3–1.2)
Total Protein: 7.4 g/dL (ref 6.5–8.1)

## 2022-01-27 LAB — URINALYSIS, ROUTINE W REFLEX MICROSCOPIC
Bilirubin Urine: NEGATIVE
Glucose, UA: NEGATIVE mg/dL
Hgb urine dipstick: NEGATIVE
Ketones, ur: NEGATIVE mg/dL
Nitrite: NEGATIVE
Protein, ur: 30 mg/dL — AB
Specific Gravity, Urine: 1.031 — ABNORMAL HIGH (ref 1.005–1.030)
pH: 6 (ref 5.0–8.0)

## 2022-01-27 LAB — LIPASE, BLOOD: Lipase: 13 U/L (ref 11–51)

## 2022-01-27 SURGERY — LAPAROSCOPIC CHOLECYSTECTOMY
Anesthesia: General

## 2022-01-27 MED ORDER — PROPOFOL 10 MG/ML IV BOLUS
INTRAVENOUS | Status: AC
  Filled 2022-01-27: qty 20

## 2022-01-27 MED ORDER — SUCCINYLCHOLINE CHLORIDE 200 MG/10ML IV SOSY
PREFILLED_SYRINGE | INTRAVENOUS | Status: DC | PRN
  Administered 2022-01-27: 100 mg via INTRAVENOUS

## 2022-01-27 MED ORDER — SODIUM CHLORIDE 0.9 % IR SOLN
Status: DC | PRN
  Administered 2022-01-27: 1000 mL

## 2022-01-27 MED ORDER — OXYCODONE HCL 5 MG PO TABS
5.0000 mg | ORAL_TABLET | Freq: Once | ORAL | Status: AC
  Administered 2022-01-27: 5 mg via ORAL

## 2022-01-27 MED ORDER — ORAL CARE MOUTH RINSE: 15.0000 mL | Freq: Once | OROMUCOSAL | Status: AC

## 2022-01-27 MED ORDER — LIDOCAINE 2% (20 MG/ML) 5 ML SYRINGE
INTRAMUSCULAR | Status: DC | PRN
  Administered 2022-01-27: 60 mg via INTRAVENOUS

## 2022-01-27 MED ORDER — MIDAZOLAM HCL 2 MG/2ML IJ SOLN
INTRAMUSCULAR | Status: DC | PRN
  Administered 2022-01-27: 2 mg via INTRAVENOUS

## 2022-01-27 MED ORDER — OXYCODONE HCL 5 MG PO TABS
ORAL_TABLET | ORAL | Status: AC
  Filled 2022-01-27: qty 1

## 2022-01-27 MED ORDER — BUPIVACAINE HCL (PF) 0.25 % IJ SOLN
INTRAMUSCULAR | Status: AC
  Filled 2022-01-27: qty 30

## 2022-01-27 MED ORDER — KETOROLAC TROMETHAMINE 15 MG/ML IJ SOLN
15.0000 mg | Freq: Once | INTRAMUSCULAR | Status: AC
  Administered 2022-01-27: 15 mg via INTRAVENOUS
  Filled 2022-01-27: qty 1

## 2022-01-27 MED ORDER — CHLORHEXIDINE GLUCONATE 0.12 % MT SOLN
OROMUCOSAL | Status: AC
  Administered 2022-01-27: 15 mL via OROMUCOSAL
  Filled 2022-01-27: qty 15

## 2022-01-27 MED ORDER — PHENYLEPHRINE 40 MCG/ML (10ML) SYRINGE FOR IV PUSH (FOR BLOOD PRESSURE SUPPORT)
PREFILLED_SYRINGE | INTRAVENOUS | Status: DC | PRN
  Administered 2022-01-27: 40 ug via INTRAVENOUS
  Administered 2022-01-27: 80 ug via INTRAVENOUS

## 2022-01-27 MED ORDER — BUPIVACAINE HCL 0.25 % IJ SOLN
INTRAMUSCULAR | Status: DC | PRN
  Administered 2022-01-27: 30 mL

## 2022-01-27 MED ORDER — FENTANYL CITRATE (PF) 250 MCG/5ML IJ SOLN
INTRAMUSCULAR | Status: AC
  Filled 2022-01-27: qty 5

## 2022-01-27 MED ORDER — OXYCODONE-ACETAMINOPHEN 5-325 MG PO TABS: 1.0000 | ORAL_TABLET | ORAL | 0 refills | Status: AC | PRN

## 2022-01-27 MED ORDER — ACETAMINOPHEN 10 MG/ML IV SOLN
INTRAVENOUS | Status: AC
  Filled 2022-01-27: qty 100

## 2022-01-27 MED ORDER — LACTATED RINGERS IV SOLN: INTRAVENOUS | Status: DC

## 2022-01-27 MED ORDER — PROPOFOL 10 MG/ML IV BOLUS
INTRAVENOUS | Status: DC | PRN
  Administered 2022-01-27: 160 mg via INTRAVENOUS

## 2022-01-27 MED ORDER — FENTANYL CITRATE (PF) 250 MCG/5ML IJ SOLN
INTRAMUSCULAR | Status: DC | PRN
  Administered 2022-01-27: 50 ug via INTRAVENOUS
  Administered 2022-01-27: 100 ug via INTRAVENOUS

## 2022-01-27 MED ORDER — FENTANYL CITRATE (PF) 100 MCG/2ML IJ SOLN
INTRAMUSCULAR | Status: AC
  Filled 2022-01-27: qty 2

## 2022-01-27 MED ORDER — MIDAZOLAM HCL 2 MG/2ML IJ SOLN
INTRAMUSCULAR | Status: AC
  Filled 2022-01-27: qty 2

## 2022-01-27 MED ORDER — CEFAZOLIN SODIUM-DEXTROSE 2-3 GM-%(50ML) IV SOLR
INTRAVENOUS | Status: DC | PRN
  Administered 2022-01-27: 2 g via INTRAVENOUS

## 2022-01-27 MED ORDER — LACTATED RINGERS IV BOLUS
1000.0000 mL | Freq: Once | INTRAVENOUS | Status: AC
  Administered 2022-01-27: 1000 mL via INTRAVENOUS

## 2022-01-27 MED ORDER — SUGAMMADEX SODIUM 200 MG/2ML IV SOLN
INTRAVENOUS | Status: DC | PRN
Start: 2022-01-27 — End: 2022-01-27
  Administered 2022-01-27: 200 mg via INTRAVENOUS

## 2022-01-27 MED ORDER — ONDANSETRON 4 MG PO TBDP
4.0000 mg | ORAL_TABLET | Freq: Once | ORAL | Status: AC | PRN
  Administered 2022-01-27: 4 mg via ORAL
  Filled 2022-01-27: qty 1

## 2022-01-27 MED ORDER — ROCURONIUM BROMIDE 10 MG/ML (PF) SYRINGE
PREFILLED_SYRINGE | INTRAVENOUS | Status: DC | PRN
  Administered 2022-01-27: 20 mg via INTRAVENOUS
  Administered 2022-01-27: 40 mg via INTRAVENOUS

## 2022-01-27 MED ORDER — CHLORHEXIDINE GLUCONATE 0.12 % MT SOLN: 15.0000 mL | Freq: Once | OROMUCOSAL | Status: AC

## 2022-01-27 MED ORDER — ONDANSETRON HCL 4 MG/2ML IJ SOLN
INTRAMUSCULAR | Status: DC | PRN
Start: 2022-01-27 — End: 2022-01-27
  Administered 2022-01-27: 4 mg via INTRAVENOUS

## 2022-01-27 MED ORDER — MORPHINE SULFATE (PF) 4 MG/ML IV SOLN
4.0000 mg | Freq: Once | INTRAVENOUS | Status: AC
  Administered 2022-01-27: 4 mg via INTRAVENOUS
  Filled 2022-01-27: qty 1

## 2022-01-27 MED ORDER — DEXAMETHASONE SODIUM PHOSPHATE 10 MG/ML IJ SOLN
INTRAMUSCULAR | Status: DC | PRN
  Administered 2022-01-27: 10 mg via INTRAVENOUS

## 2022-01-27 MED ORDER — 0.9 % SODIUM CHLORIDE (POUR BTL) OPTIME
TOPICAL | Status: DC | PRN
Start: 2022-01-27 — End: 2022-01-27
  Administered 2022-01-27: 1000 mL

## 2022-01-27 MED ORDER — DIPHENHYDRAMINE HCL 50 MG/ML IJ SOLN
12.5000 mg | Freq: Once | INTRAMUSCULAR | Status: AC
Start: 2022-01-27 — End: 2022-01-27
  Administered 2022-01-27: 12.5 mg via INTRAVENOUS
  Filled 2022-01-27: qty 1

## 2022-01-27 MED ORDER — FENTANYL CITRATE (PF) 100 MCG/2ML IJ SOLN
25.0000 ug | INTRAMUSCULAR | Status: DC | PRN
  Administered 2022-01-27 (×2): 50 ug via INTRAVENOUS

## 2022-01-27 MED ORDER — ACETAMINOPHEN 10 MG/ML IV SOLN
INTRAVENOUS | Status: DC | PRN
  Administered 2022-01-27: 1000 mg via INTRAVENOUS

## 2022-01-27 MED ORDER — METOCLOPRAMIDE HCL 5 MG/ML IJ SOLN
10.0000 mg | Freq: Once | INTRAMUSCULAR | Status: AC
  Administered 2022-01-27: 10 mg via INTRAVENOUS
  Filled 2022-01-27: qty 2

## 2022-01-27 MED ORDER — ACETAMINOPHEN 500 MG PO TABS: 1000.0000 mg | ORAL_TABLET | Freq: Once | ORAL | Status: DC

## 2022-01-27 SURGICAL SUPPLY — 41 items
APPLIER CLIP 5 13 M/L LIGAMAX5 (MISCELLANEOUS) ×2
BAG COUNTER SPONGE SURGICOUNT (BAG) ×2 IMPLANT
CANISTER SUCT 3000ML PPV (MISCELLANEOUS) ×2 IMPLANT
CHLORAPREP W/TINT 26 (MISCELLANEOUS) ×2 IMPLANT
CLIP APPLIE 5 13 M/L LIGAMAX5 (MISCELLANEOUS) ×1 IMPLANT
COVER SURGICAL LIGHT HANDLE (MISCELLANEOUS) ×2 IMPLANT
DERMABOND ADVANCED (GAUZE/BANDAGES/DRESSINGS) ×1
DERMABOND ADVANCED .7 DNX12 (GAUZE/BANDAGES/DRESSINGS) ×1 IMPLANT
ELECT REM PT RETURN 9FT ADLT (ELECTROSURGICAL) ×2
ELECTRODE REM PT RTRN 9FT ADLT (ELECTROSURGICAL) ×1 IMPLANT
ENDOLOOP SUT PDS II  0 18 (SUTURE) ×1
ENDOLOOP SUT PDS II 0 18 (SUTURE) IMPLANT
GLOVE SRG 8 PF TXTR STRL LF DI (GLOVE) ×1 IMPLANT
GLOVE SURG ENC MOIS LTX SZ7.5 (GLOVE) ×2 IMPLANT
GLOVE SURG UNDER POLY LF SZ8 (GLOVE) ×2
GOWN STRL REUS W/ TWL LRG LVL3 (GOWN DISPOSABLE) ×2 IMPLANT
GOWN STRL REUS W/ TWL XL LVL3 (GOWN DISPOSABLE) ×1 IMPLANT
GOWN STRL REUS W/TWL LRG LVL3 (GOWN DISPOSABLE) ×2
GOWN STRL REUS W/TWL XL LVL3 (GOWN DISPOSABLE) ×4
GRASPER SUT TROCAR 14GX15 (MISCELLANEOUS) ×1 IMPLANT
KIT BASIN OR (CUSTOM PROCEDURE TRAY) ×2 IMPLANT
KIT TURNOVER KIT B (KITS) ×2 IMPLANT
NDL INSUFFLATION 14GA 120MM (NEEDLE) ×1 IMPLANT
NEEDLE INSUFFLATION 14GA 120MM (NEEDLE) ×2 IMPLANT
NS IRRIG 1000ML POUR BTL (IV SOLUTION) ×2 IMPLANT
PAD ARMBOARD 7.5X6 YLW CONV (MISCELLANEOUS) ×2 IMPLANT
POUCH RETRIEVAL ECOSAC 10 (ENDOMECHANICALS) ×1 IMPLANT
POUCH RETRIEVAL ECOSAC 10MM (ENDOMECHANICALS) ×2
SCISSORS LAP 5X35 DISP (ENDOMECHANICALS) ×2 IMPLANT
SET IRRIG TUBING LAPAROSCOPIC (IRRIGATION / IRRIGATOR) ×2 IMPLANT
SET TUBE SMOKE EVAC HIGH FLOW (TUBING) ×2 IMPLANT
SLEEVE ENDOPATH XCEL 5M (ENDOMECHANICALS) ×4 IMPLANT
SPECIMEN JAR SMALL (MISCELLANEOUS) ×2 IMPLANT
SUT MNCRL AB 4-0 PS2 18 (SUTURE) ×2 IMPLANT
TOWEL GREEN STERILE (TOWEL DISPOSABLE) ×2 IMPLANT
TOWEL GREEN STERILE FF (TOWEL DISPOSABLE) ×2 IMPLANT
TRAY LAPAROSCOPIC MC (CUSTOM PROCEDURE TRAY) ×2 IMPLANT
TROCAR XCEL NON-BLD 11X100MML (ENDOMECHANICALS) ×2 IMPLANT
TROCAR XCEL NON-BLD 5MMX100MML (ENDOMECHANICALS) ×2 IMPLANT
WARMER LAPAROSCOPE (MISCELLANEOUS) ×1 IMPLANT
WATER STERILE IRR 1000ML POUR (IV SOLUTION) ×1 IMPLANT

## 2022-01-27 NOTE — Anesthesia Postprocedure Evaluation (Signed)
Anesthesia Post Note ? ?Patient: Mia Ortiz ? ?Procedure(s) Performed: LAPAROSCOPIC CHOLECYSTECTOMY ? ?  ? ?Patient location during evaluation: PACU ?Anesthesia Type: General ?Level of consciousness: awake and alert ?Pain management: pain level controlled ?Vital Signs Assessment: post-procedure vital signs reviewed and stable ?Respiratory status: spontaneous breathing, nonlabored ventilation, respiratory function stable and patient connected to nasal cannula oxygen ?Cardiovascular status: blood pressure returned to baseline and stable ?Postop Assessment: no apparent nausea or vomiting ?Anesthetic complications: no ? ? ?No notable events documented. ? ?Last Vitals:  ?Vitals:  ? 01/27/22 1615 01/27/22 1630  ?BP: 123/73 125/71  ?Pulse: 72 78  ?Resp: 10 19  ?Temp:  36.6 ?C  ?SpO2: 98% 98%  ?  ?Last Pain:  ?Vitals:  ? 01/27/22 1630  ?TempSrc:   ?PainSc: 5   ? ? ?  ?  ?  ?  ?  ?  ? ?March Rummage Dnya Hickle ? ? ? ? ?

## 2022-01-27 NOTE — Anesthesia Procedure Notes (Signed)
Procedure Name: Intubation ?Date/Time: 01/27/2022 2:30 PM ?Performed by: Imagene Riches, CRNA ?Pre-anesthesia Checklist: Patient identified, Emergency Drugs available, Suction available and Patient being monitored ?Patient Re-evaluated:Patient Re-evaluated prior to induction ?Oxygen Delivery Method: Circle System Utilized ?Preoxygenation: Pre-oxygenation with 100% oxygen ?Induction Type: IV induction, Rapid sequence and Cricoid Pressure applied ?Ventilation: Mask ventilation without difficulty ?Laryngoscope Size: Mac and 3 ?Grade View: Grade I ?Tube type: Oral ?Tube size: 7.0 mm ?Number of attempts: 1 ?Airway Equipment and Method: Stylet ?Placement Confirmation: ETT inserted through vocal cords under direct vision, positive ETCO2 and breath sounds checked- equal and bilateral ?Secured at: 21 cm ?Tube secured with: Tape ?Dental Injury: Teeth and Oropharynx as per pre-operative assessment  ? ? ? ? ?

## 2022-01-27 NOTE — ED Notes (Signed)
Pt transferring POV with her mother to [facility] [place]. Belongings with patient  ?

## 2022-01-27 NOTE — ED Provider Notes (Signed)
?[facility] EMERGENCY DEPARTMENT ?Provider Note ? ? ?CSN: [id number] ?Arrival date & time: 01/27/22  0848 ? ?  ? ?History ? ?Chief Complaint  ?Patient presents with  ? Abdominal Pain  ? ? ?Mia Ortiz is a 31 y.o. female. ? ? ?Abdominal Pain ? ?31 year old female presenting to the emergency department from the [facility] emergency department for general surgical evaluation for elective cholecystectomy in the setting of symptomatic cholelithiasis.  The patient states that for the past 2 weeks she has been having severe right-sided abdominal pain radiating to her back from her right upper quadrant.  Associated radiation to her right shoulder and mild shortness of breath.  Pain is sharp.  She also has been having nausea and vomiting associated with this.  She was seen at the emergency department [facility] where her work-up was significant for a CMP without biliary dysfunction or liver dysfunction, CBC with a leukocytosis to 15, urinalysis with few bacteria, moderate leukocytes, negative nitrites, right upper quadrant ultrasound with cholelithiasis without evidence of cholecystitis.  She had a negative COVID and influenza PCR test, normal lipase and urine pregnancy negative. ? ?On arrival, The patient was stable.  She complained of 6 out of 10 abdominal pain in her right upper quadrant.  Her physical exam revealed a positive Murphy sign in the right upper quadrant.  Pain medicine was ordered and nausea medicine was ordered. ? ?Home Medications ?Prior to Admission medications   ?Medication Sig Start Date End Date Taking? Authorizing Provider  ?APPLE CIDER VINEGAR PO Take 2 tablets by mouth at bedtime. gummies   Yes [provider]  ?buPROPion (WELLBUTRIN XL) 300 MG 24 hr tablet Take 300 mg by mouth as needed. 02/21/21  Yes [provider]  ?diphenhydrAMINE (BENADRYL) 25 MG tablet Take 25 mg by mouth at bedtime.   Yes [provider]  ?escitalopram (LEXAPRO) 10 MG tablet  Take 10 mg by mouth daily. 05/05/21  Yes [provider]  ?etonogestrel-ethinyl estradiol (NUVARING) 0.12-0.015 MG/24HR vaginal ring Place 1 each vaginally every 28 (twenty-eight) days.   Yes [provider]  ?loratadine (CLARITIN) 10 MG tablet Take 10 mg by mouth as needed for allergies.   Yes [provider]  ?   ? ?Allergies    ?Famotidine   ? ?Review of Systems   ?Review of Systems  ?Gastrointestinal:  Positive for abdominal pain.  ?All other systems reviewed and are negative. ? ?Physical Exam ?Updated Vital Signs ?BP 112/70   Pulse 96   Temp 97.9 ?F (36.6 ?C) (Oral)   Resp 16   SpO2 97%  ?Physical Exam ?Vitals and nursing note reviewed.  ?Constitutional:   ?   General: She is not in acute distress. ?HENT:  ?   Head: Normocephalic and atraumatic.  ?Eyes:  ?   Conjunctiva/sclera: Conjunctivae normal.  ?   Pupils: Pupils are equal, round, and reactive to light.  ?Cardiovascular:  ?   Rate and Rhythm: Normal rate and regular rhythm.  ?Pulmonary:  ?   Effort: Pulmonary effort is normal. No respiratory distress.  ?Abdominal:  ?   General: There is no distension.  ?   Tenderness: There is abdominal tenderness in the right upper quadrant and epigastric area. There is guarding. Positive signs include Murphy's sign.  ?Musculoskeletal:     ?   General: No deformity or signs of injury.  ?   Cervical back: Neck supple.  ?Skin: ?   Findings: No lesion or rash.  ?Neurological:  ?  General: No focal deficit present.  ?   Mental Status: She is alert. Mental status is at baseline.  ? ? ?ED Results / Procedures / Treatments   ?Labs ?(all labs ordered are listed, but only abnormal results are displayed) ?Labs Reviewed  ?COMPREHENSIVE METABOLIC PANEL - Abnormal; Notable for the following components:  ?    Result Value  ? CO2 20 (*)   ? Glucose, Bld 103 (*)   ? AST 13 (*)   ? All other components within normal limits  ?CBC - Abnormal; Notable for the following components:  ? WBC 15.0 (*)   ? All other  components within normal limits  ?URINALYSIS, ROUTINE W REFLEX MICROSCOPIC - Abnormal; Notable for the following components:  ? APPearance HAZY (*)   ? Specific Gravity, Urine 1.031 (*)   ? Protein, ur 30 (*)   ? Leukocytes,Ua MODERATE (*)   ? Bacteria, UA FEW (*)   ? All other components within normal limits  ?RESP PANEL BY RT-PCR (FLU A&B, COVID) ARPGX2  ?LIPASE, BLOOD  ?PREGNANCY, URINE  ? ? ?EKG ?None ? ?Radiology ?US Abdomen Limited RUQ (LIVER/GB) ? ?Result Date: 01/27/2022 ?CLINICAL DATA:  Right upper quadrant pain EXAM: ULTRASOUND ABDOMEN LIMITED RIGHT UPPER QUADRANT COMPARISON:  None. FINDINGS: Gallbladder: Single mobile gallstone measuring 2.2 cm. No gallbladder wall thickening or pericholecystic fluid. Positive sonographic Murphy sign. Common bile duct: Diameter: 3 mm Liver: No focal lesion identified. Within normal limits in parenchymal echogenicity. Portal vein is patent on color Doppler imaging with normal direction of blood flow towards the liver. Other: None. IMPRESSION: 1. Cholelithiasis without sonographic evidence of acute cholecystitis. Electronically Signed   By: Elige Ko M.D.   On: 01/27/2022 10:00   ? ?Procedures ?Procedures  ? ? ?Medications Ordered in ED ?Medications  ?ondansetron (ZOFRAN-ODT) disintegrating tablet 4 mg (4 mg Oral Given 01/27/22 0947)  ?lactated ringers bolus 1,000 mL (0 mLs Intravenous Stopped 01/27/22 1050)  ?ketorolac (TORADOL) 15 MG/ML injection 15 mg (15 mg Intravenous Given 01/27/22 1105)  ?morphine (PF) 4 MG/ML injection 4 mg (4 mg Intravenous Given 01/27/22 1256)  ?metoCLOPramide (REGLAN) injection 10 mg (10 mg Intravenous Given 01/27/22 1312)  ?diphenhydrAMINE (BENADRYL) injection 12.5 mg (12.5 mg Intravenous Given 01/27/22 1312)  ? ? ?ED Course/ Medical Decision Making/ A&P ?  ?                        ?Medical Decision Making ?Amount and/or Complexity of Data Reviewed ?Labs: ordered. ?Radiology: ordered. ? ?Risk ?Prescription drug management. ? ? ?31 year old female  presenting to the emergency department from the [facility] emergency department for general surgical evaluation for elective cholecystectomy in the setting of symptomatic cholelithiasis.  The patient states that for the past 2 weeks she has been having severe right-sided abdominal pain radiating to her back from her right upper quadrant.  Associated radiation to her right shoulder and mild shortness of breath.  Pain is sharp.  She also has been having nausea and vomiting associated with this.  She was seen at the emergency department [facility] where her work-up was significant for a CMP without biliary dysfunction or liver dysfunction, CBC with a leukocytosis to 15, urinalysis with few bacteria, moderate leukocytes, negative nitrites, right upper quadrant ultrasound with cholelithiasis without evidence of cholecystitis.  She had a negative COVID and influenza PCR test, normal lipase and urine pregnancy negative. ? ?On arrival, The patient was stable.  She complained of 6 out of 10 abdominal pain  in her right upper quadrant.  Her physical exam revealed a positive Murphy sign in the right upper quadrant.  Pain medicine was ordered and nausea medicine was ordered. ? ?General surgery was paged on patient arrival and the patient was taken to the OR shortly after arrival for elective cholecystectomy. ? ? ?Final Clinical Impression(s) / ED Diagnoses ?Final diagnoses:  ?Right sided abdominal pain  ?Calculus of gallbladder without cholecystitis without obstruction  ? ? ?Rx / DC Orders ?ED Discharge Orders   ? ? None  ? ?  ? ? ?  ?Ernie Avena, MD ?01/27/22 1324 ? ?

## 2022-01-27 NOTE — Transfer of Care (Signed)
Immediate Anesthesia Transfer of Care Note ? ?Patient: Mia Ortiz ? ?Procedure(s) Performed: LAPAROSCOPIC CHOLECYSTECTOMY ? ?Patient Location: PACU ? ?Anesthesia Type:General ? ?Level of Consciousness: drowsy and patient cooperative ? ?Airway & Oxygen Therapy: Patient Spontanous Breathing and Patient connected to face mask oxygen ? ?Post-op Assessment: Report given to RN and Post -op Vital signs reviewed and stable ? ?Post vital signs: Reviewed and stable ? ?Last Vitals:  ?Vitals Value Taken Time  ?BP 130/77 01/27/22 1530  ?Temp 36.4 ?C 01/27/22 1530  ?Pulse 81 01/27/22 1533  ?Resp 22 01/27/22 1533  ?SpO2 100 % 01/27/22 1533  ?Vitals shown include unvalidated device data. ? ?Last Pain:  ?Vitals:  ? 01/27/22 1256  ?TempSrc:   ?PainSc: 6   ?   ? ?Patients Stated Pain Goal: 2 (01/27/22 0910) ? ?Complications: No notable events documented. ?

## 2022-01-27 NOTE — Discharge Instructions (Addendum)
 CHOLECYSTECTOMY POST OPERATIVE INSTRUCTIONS  Thinking Clearly  The anesthesia may cause you to feel different for 1 or 2 days. Do not drive, drink alcohol, or make any big decisions for at least 2 days.  Nutrition When you wake up, you will be able to drink small amounts of liquid. If you do not feel sick, you can slowly advance your diet to regular foods. Continue to drink lots of fluids, usually about 8 to 10 glasses per day. Eat a high-fiber diet so you don't strain during bowel movements. High-Fiber Foods Foods high in fiber include beans, bran cereals and whole-grain breads, peas, dried fruit (figs, apricots, and dates), raspberries, blackberries, strawberries, sweet corn, broccoli, baked potatoes with skin, plums, pears, apples, greens, and nuts. Activity Slowly increase your activity. Be sure to get up and walk every hour or so to prevent blood clots. No heavy lifting or strenuous activity for 4 weeks following surgery to prevent hernias at your incision sites It is normal to feel tired. You may need more sleep than usual.  Get your rest but make sure to get up and move around frequently to prevent blood clots and pneumonia.  Work and Return to School You can go back to work when you feel well enough. Discuss the timing with your surgeon. You can usually go back to school or work 1 week after an operation. If your work requires heavy lifting or strenuous activity you need to be placed on light duty for 4 weeks following surgery. You can return to gym class, sports or other physical activities 4 weeks after surgery.  Wound Care Always wash your hands before and after touching near your incision site. Do not soak in a bathtub until cleared at your follow up appointment. You may take a shower 24 hours after surgery. A small amount of drainage from the incision is normal. If the drainage is thick and yellow or the site is red, you may have an infection, so call your surgeon. If you  have a drain in one of your incisions, it will be taken out in office when the drainage stops. Steri-Strips will fall off in 7 to 10 days or they will be removed during your first office visit. If you have dermabond glue covering over the incision, allow the glue to flake off on its own. Avoid wearing tight or rough clothing. It may rub your incisions and make it harder for them to heal. Protect the new skin, especially from the sun. The sun can burn and cause darker scarring. Your scar will heal in about 4 to 6 weeks and will become softer and continue to fade over the next year.  The cosmetic appearance of the incisions will improve over the course of the first year after surgery. Sensation around your incision will return in a few weeks or months.  Bowel Movements After intestinal surgery, you may have loose watery stools for several days. If watery diarrhea lasts longer than 3 days, contact your surgeon. Pain medication (narcotics) can cause constipation. Increase the fiber in your diet with high-fiber foods if you are constipated. You can take an over the counter stool softener like Colace to avoid constipation.  Additional over the counter medications can also be used if Colace isn't sufficient (for example, Milk of Magnesia or Miralax).  Pain The amount of pain is different for each person. Some people need only 1 to 3 doses of pain control medication, while others need more. Take alternating doses of tylenol   and ibuprofen around the clock for the first five days following surgery.  This will provide a baseline of pain control and help with inflammation.  Take the narcotic pain medication in addition if needed for severe pain.  Contact Your Surgeon at [phone], if you have: Pain in your right upper abdomen like a gallbladder attack. Pain that will not go away Pain that gets worse A fever of more than 101F (38.3C) Repeated vomiting Swelling, redness, bleeding, or bad-smelling  drainage from your wound site Strong abdominal pain No bowel movement or unable to pass gas for 3 days Watery diarrhea lasting longer than 3 days  Pain Control The goal of pain control is to minimize pain, keep you moving and help you heal. Your surgical team will work with you on your pain plan. Most often a combination of therapies and medications are used to control your pain. You may also be given medication (local anesthetic) at the surgical site. This may help control your pain for several days. Extreme pain puts extra stress on your body at a time when your body needs to focus on healing. Do not wait until your pain has reached a level "10" or is unbearable before telling your doctor or nurse. It is much easier to control pain before it becomes severe. Following a laparoscopic procedure, pain is sometimes felt in the shoulder. This is due to the gas inserted into your abdomen during the procedure. Moving and walking helps to decrease the gas and the right shoulder pain.  Use the guide below for ways to manage your post-operative pain. Learn more by going to facs.org/safepaincontrol.  How Intense Is My Pain Common Therapies to Feel Better       I hardly notice my pain, and it does not interfere with my activities.  I notice my pain and it distracts me, but I can still do activities (sitting up, walking, standing).  Non-Medication Therapies  Ice (in a bag, applied over clothing at the surgical site), elevation, rest, meditation, massage, distraction (music, TV, play) walking and mild exercise Splinting the abdomen with pillows +  Non-Opioid Medications Acetaminophen (Tylenol) Non-steroidal anti-inflammatory drugs (NSAIDS) Aspirin, Ibuprofen (Motrin, Advil) Naproxen (Aleve) Take these as needed, when you feel pain. Both acetaminophen and NSAIDs help to decrease pain and swelling (inflammation).      My pain is hard to ignore and is more noticeable even when I rest.  My  pain interferes with my usual activities.  Non-Medication Therapies  +  Non-Opioid medications  Take on a regular schedule (around-the-clock) instead of as needed. (For example, Tylenol every 6 hours at 9:00 am, 3:00 pm, 9:00 pm, 3:00 am and Motrin every 6 hours at 12:00 am, 6:00 am, 12:00 pm, 6:00 pm)         I am focused on my pain, and I am not doing my daily activities.  I am groaning in pain, and I cannot sleep. I am unable to do anything.  My pain is as bad as it could be, and nothing else matters.  Non-Medication Therapies  +  Around-the-Clock Non-Opioid Medications  +  Short-acting opioids  Opioids should be used with other medications to manage severe pain. Opioids block pain and give a feeling of euphoria (feel high). Addiction, a serious side effect of opioids, is rare with short-term (a few days) use.  Examples of short-acting opioids include: Tramadol (Ultram), Hydrocodone (Norco, Vicodin), Hydromorphone (Dilaudid), Oxycodone (Oxycontin)     The above directions have been adapted from   the [employer] Surgical Patient Education Program.  Please refer to the ACS website if needed: https://www.facs.org/-/media/files/education/patient-ed/cholesys.ashx.   Kayen Grabel, MD [facility] [address], [address], [place], [place]  [address] ?  [address] [address], [place],    [address] [phone] ? [phone] ? FAX [phone] Web site: [email or web address]  

## 2022-01-27 NOTE — ED Triage Notes (Signed)
Severe abdominal pain right flank at midnight, pt then awoke with n/v/d. Pt states this happened 2 weeks ago and then subsided.  ?

## 2022-01-27 NOTE — ED Provider Notes (Signed)
?[facility] EMERGENCY DEPT ?Provider Note ? ? ?CSN: [id number] ?Arrival date & time: 01/27/22  0848 ? ?  ? ?History ? ?Chief Complaint  ?Patient presents with  ? Abdominal Pain  ? ? ?Mia Ortiz is a 31 y.o. female. ? ?HPI ?31 year old female presents with right upper quadrant pain, vomiting, diarrhea.  Started this morning suddenly around 6 AM.  She had a similar episode that lasted for about a day starting 2 weeks ago.  Ever since then she has had some right shoulder discomfort and upper abdominal discomfort but nowhere near the pain she had this morning.  Now she is having right upper quadrant and right back pain.  The vomiting and diarrhea has improved but it was pretty significant for a couple hours.  No fevers or urinary symptoms. Pain is rated as 8/10. ? ?Home Medications ?Prior to Admission medications   ?Medication Sig Start Date End Date Taking? Authorizing Provider  ?APPLE CIDER VINEGAR PO Take 2 tablets by mouth at bedtime. gummies   Yes [provider]  ?buPROPion (WELLBUTRIN XL) 300 MG 24 hr tablet Take 300 mg by mouth as needed. 02/21/21  Yes [provider]  ?diphenhydrAMINE (BENADRYL) 25 MG tablet Take 25 mg by mouth at bedtime.   Yes [provider]  ?escitalopram (LEXAPRO) 10 MG tablet Take 10 mg by mouth daily. 05/05/21  Yes [provider]  ?etonogestrel-ethinyl estradiol (NUVARING) 0.12-0.015 MG/24HR vaginal ring Place 1 each vaginally every 28 (twenty-eight) days.   Yes [provider]  ?loratadine (CLARITIN) 10 MG tablet Take 10 mg by mouth as needed for allergies.   Yes [provider]  ?   ? ?Allergies    ?Famotidine   ? ?Review of Systems   ?Review of Systems  ?Constitutional:  Negative for fever.  ?Gastrointestinal:  Positive for abdominal pain, diarrhea and vomiting.  ?Genitourinary:  Positive for flank pain.  ?Musculoskeletal:  Positive for back pain.  ? ?Physical Exam ?Updated Vital Signs ?BP 112/70   Pulse 96    Temp 99.3 ?F (37.4 ?C)   Resp 16   Ht 5\' 6"  (1.676 m)   Wt 98.4 kg   SpO2 97%   BMI 35.02 kg/m?  ?Physical Exam ?Vitals and nursing note reviewed.  ?Constitutional:   ?   General: She is not in acute distress. ?   Appearance: She is well-developed. She is not ill-appearing or diaphoretic.  ?HENT:  ?   Head: Normocephalic and atraumatic.  ?Cardiovascular:  ?   Rate and Rhythm: Normal rate and regular rhythm.  ?   Heart sounds: Normal heart sounds.  ?Pulmonary:  ?   Effort: Pulmonary effort is normal.  ?   Breath sounds: Normal breath sounds.  ?Abdominal:  ?   Palpations: Abdomen is soft.  ?   Tenderness: There is abdominal tenderness in the right upper quadrant. There is right CVA tenderness. There is no left CVA tenderness.  ?Skin: ?   General: Skin is warm and dry.  ?Neurological:  ?   Mental Status: She is alert.  ? ?ED Results / Procedures / Treatments   ?Labs ?(all labs ordered are listed, but only abnormal results are displayed) ?Labs Reviewed  ?COMPREHENSIVE METABOLIC PANEL - Abnormal; Notable for the following components:  ?    Result Value  ? CO2 20 (*)   ? Glucose, Bld 103 (*)   ? AST 13 (*)   ? All other components within normal limits  ?CBC - Abnormal; Notable for the  following components:  ? WBC 15.0 (*)   ? All other components within normal limits  ?URINALYSIS, ROUTINE W REFLEX MICROSCOPIC - Abnormal; Notable for the following components:  ? APPearance HAZY (*)   ? Specific Gravity, Urine 1.031 (*)   ? Protein, ur 30 (*)   ? Leukocytes,Ua MODERATE (*)   ? Bacteria, UA FEW (*)   ? All other components within normal limits  ?RESP PANEL BY RT-PCR (FLU A&B, COVID) ARPGX2  ?LIPASE, BLOOD  ?PREGNANCY, URINE  ? ? ?EKG ?None ? ?Radiology ?US Abdomen Limited RUQ (LIVER/GB) ? ?Result Date: 01/27/2022 ?CLINICAL DATA:  Right upper quadrant pain EXAM: ULTRASOUND ABDOMEN LIMITED RIGHT UPPER QUADRANT COMPARISON:  None. FINDINGS: Gallbladder: Single mobile gallstone measuring 2.2 cm. No gallbladder wall thickening  or pericholecystic fluid. Positive sonographic Murphy sign. Common bile duct: Diameter: 3 mm Liver: No focal lesion identified. Within normal limits in parenchymal echogenicity. Portal vein is patent on color Doppler imaging with normal direction of blood flow towards the liver. Other: None. IMPRESSION: 1. Cholelithiasis without sonographic evidence of acute cholecystitis. Electronically Signed   By: Elige Ko M.D.   On: 01/27/2022 10:00   ? ?Procedures ?Procedures  ? ? ?Medications Ordered in ED ?Medications  ?lactated ringers infusion ( Intravenous Continued from Pre-op 01/27/22 1423)  ?acetaminophen (TYLENOL) tablet 1,000 mg (has no administration in time range)  ?ondansetron (ZOFRAN-ODT) disintegrating tablet 4 mg (4 mg Oral Given 01/27/22 0947)  ?lactated ringers bolus 1,000 mL (0 mLs Intravenous Stopped 01/27/22 1050)  ?ketorolac (TORADOL) 15 MG/ML injection 15 mg (15 mg Intravenous Given 01/27/22 1105)  ?morphine (PF) 4 MG/ML injection 4 mg (4 mg Intravenous Given 01/27/22 1256)  ?metoCLOPramide (REGLAN) injection 10 mg (10 mg Intravenous Given 01/27/22 1312)  ?diphenhydrAMINE (BENADRYL) injection 12.5 mg (12.5 mg Intravenous Given 01/27/22 1312)  ?chlorhexidine (PERIDEX) 0.12 % solution 15 mL (15 mLs Mouth/Throat Given 01/27/22 1348)  ?  Or  ?MEDLINE mouth rinse ( Mouth Rinse See Alternative 01/27/22 1348)  ? ? ?ED Course/ Medical Decision Making/ A&P ?  ?                        ?Medical Decision Making ?Amount and/or Complexity of Data Reviewed ?Labs: ordered. ?Radiology: ordered. ? ?Risk ?Prescription drug management. ? ? ?Patient's pain seems to be coming from a large gallstone.  There is no overt evidence of cholecystitis.  However she does have a significant leukocytosis of 15, which may be reactive to vomiting versus inflammation of her gallbladder.  She has initially declined pain medicine though later wanted some pain medicine that was narcotics so she was given Toradol.  She was also given IV Zofran for nausea.   Labs otherwise reviewed and showed benign LFTs/lipase.  I have personally viewed the images of her ultrasound and agree that there is a sizable gallstone.  I consulted general surgery, Dr. Dossie Der, who advises to send patient to preop and he will take her to the OR today.  Mom is comfortable with driving the patient's we will leave the IV in and she will drive by private vehicle. ? ? ? ? ? ? ? ?Final Clinical Impression(s) / ED Diagnoses ?Final diagnoses:  ?Right sided abdominal pain  ?Calculus of gallbladder without cholecystitis without obstruction  ? ? ?Rx / DC Orders ?ED Discharge Orders   ? ? None  ? ?  ? ? ?  ?Pricilla Loveless, MD ?01/27/22 1444 ? ?

## 2022-01-27 NOTE — ED Provider Notes (Signed)
Patient arrives from [facility] emergency department.  I went and evaluated the patient and she stated her pain was a 7/10.  She was given Toradol at [facility].  Morphine ordered for her discomfort at this time.  Patient is ambulatory and stable appearing. ?  ?Rhae Hammock, PA-C ?01/27/22 1229 ? ?  ?Regan Lemming, MD ?01/27/22 1444 ? ?

## 2022-01-27 NOTE — Op Note (Signed)
? ?Patient: Mia Ortiz (11-26-1991, [id number]) ? ?Date of Surgery: 01/27/2022  ? ?Preoperative Diagnosis: Acute Cholecystits  ? ?Postoperative Diagnosis: Acute Cholecystits  ? ?Surgical Procedure: LAPAROSCOPIC CHOLECYSTECTOMY:   ? ?Operative Team Members:  ?Surgeon(s) and Role: ?   * Salley Boxley, Nickola Major, MD - Primary  ? ?Anesthesiologist: Darral Dash, DO ?CRNA: Imagene Riches, CRNA; Thelma Comp, CRNA  ? ?Anesthesia: General  ? ?Fluids:  ?Total I/O ?In: -  ?Out: 20 [Blood:20] ? ?Complications: None ? ?Drains:  none  ? ?Specimen:  ?ID Type Source Tests Collected by Time Destination  ?1 : Gallbladder Tissue PATH Gallbladder SURGICAL PATHOLOGY Lorin Gawron, Nickola Major, MD 01/27/2022 1450   ?  ? ?Disposition:  PACU - hemodynamically stable. ? ?Plan of Care: Discharge to home after PACU ? ? ? ?Indications for Procedure: HEARTLEY Ortiz is a 31 y.o. female who presented with abdominal pain.  History, physical and imaging was concerning for cholecystitis.  Laparoscopic cholecystectomy was recommended for the patient.  The procedure itself, as well as the risks, benefits and alternatives were discussed with the patient.  Risks discussed included but were not limited to the risk of infection, bleeding, damage to nearby structures, need to convert to open procedure, incisional hernia, bile leak, common bile duct injury and the need for additional procedures or surgeries.  With this discussion complete and all questions answered the patient granted consent to proceed. ? ?Findings: Gallstones ? ?Infection status: ?Patient: Private Patient Elective Case ?Case: Urgent ?Infection Present At Time Of Surgery (PATOS):  Infected gallbladder, some spillage of bile ? ? ?Description of Procedure:  ? ?On the date stated above, the patient was taken to the operating room suite and placed in supine positioning.  Sequential compression devices were placed on the lower extremities to prevent blood clots.  General  endotracheal anesthesia was induced. Preoperative antibiotics were given.  The patient's abdomen was prepped and draped in the usual sterile fashion.  A time-out was completed verifying the correct patient, procedure, positioning and equipment needed for the case. ? ?We began by anesthetizing the skin with local anesthetic and then making a 5 mm incision just below the umbilicus.  We dissected through the subcutaneous tissues to the fascia.  The fascia was grasped and elevated using a Kocher clamp.  A Veress needle was inserted into the abdomen and the abdomen was insufflated to 15 mmHg.  A 5 mm trocar was inserted in this position under optical guidance and then the abdomen was inspected.  There was no trauma to the underlying viscera with initial trocar placement.  Any abnormal findings, other than inflammation in the right upper quadrant, are listed above in the findings section.  Three additional trocars were placed, one 12 mm trocar in the subxiphoid position, one 5 mm trocar in the midline epigastric area and one 106mm trocar in the right upper quadrant subcostally.  These were placed under direct vision without any trauma to the underlying viscera.   ? ?The patient was then placed in head up, left side down positioning.  The gallbladder was identified and dissected free from its attachments to the omentum allowing the duodenum to fall away.  The infundibulum of the gallbladder was dissected free working laterally to medially.  The cystic duct and cystic artery were dissected free from surrounding connective tissue.  The infundibulum of the gallbladder was dissected off the cystic plate.  A critical view of safety was obtained with the cystic duct and cystic artery being cleared of  connective tissues and clearly the only two structures entering into the gallbladder with the liver clearly visible behind.  Clips were then applied to the cystic duct and cystic artery and then these structures were divided. A PDS  endoloop was applied to the cystic duct. The gallbladder was dissected off the cystic plate, placed in an endocatch bag and removed from the 12 mm subxiphoid port site.  The clips were inspected and appeared effective.  The cystic plate was inspected and hemostasis was obtained using electrocautery.  A suction irrigator was used to clean the operative field.  Attention was turned to closure.  The 12 mm subxiphoid port site was closed using a 0-vicryl suture on a fascial suture passer.  The abdomen was desufflated.  The skin was closed using 4-0 monocryl and dermabond.  All sponge and needle counts were correct at the conclusion of the case. ? ? ? ?Louanna Raw, MD ?General, Bariatric, & Minimally Invasive Surgery ?[facility], [place] ? ?

## 2022-01-27 NOTE — Anesthesia Preprocedure Evaluation (Signed)
Anesthesia Evaluation  ?Patient identified by MRN, date of birth, ID band ?Patient awake ? ? ? ?Reviewed: ?Allergy & Precautions, NPO status , Patient's Chart, lab work & pertinent test results ? ?Airway ?Mallampati: II ? ?TM Distance: >3 FB ?Neck ROM: Full ? ? ? Dental ?no notable dental hx. ? ?  ?Pulmonary ?neg pulmonary ROS, former smoker,  ?  ?Pulmonary exam normal ? ? ? ? ? ? ? Cardiovascular ?negative cardio ROS ? ? ?Rhythm:Regular Rate:Normal ? ? ?  ?Neuro/Psych ?negative neurological ROS ? negative psych ROS  ? GI/Hepatic ?Neg liver ROS, Acute cholecystitis  ?  ?Endo/Other  ?diabetes ? Renal/GU ?  ?negative genitourinary ?  ?Musculoskeletal ?negative musculoskeletal ROS ?(+)  ? Abdominal ?Normal abdominal exam  (+)   ?Peds ? Hematology ?negative hematology ROS ?(+)   ?Anesthesia Other Findings ? ? Reproductive/Obstetrics ? ?  ? ? ? ? ? ? ? ? ? ? ? ? ? ?  ?  ? ? ? ? ? ? ? ? ?Anesthesia Physical ?Anesthesia Plan ? ?ASA: 2 ? ?Anesthesia Plan: General  ? ?Post-op Pain Management: Tylenol PO (pre-op)*  ? ?Induction: Intravenous ? ?PONV Risk Score and Plan: 3 and Ondansetron, Dexamethasone, Midazolam and Treatment may vary due to age or medical condition ? ?Airway Management Planned: Mask and Oral ETT ? ?Additional Equipment: None ? ?Intra-op Plan:  ? ?Post-operative Plan: Extubation in OR ? ?Informed Consent: I have reviewed the patients History and Physical, chart, labs and discussed the procedure including the risks, benefits and alternatives for the proposed anesthesia with the patient or authorized representative who has indicated his/her understanding and acceptance.  ? ? ? ?Dental advisory given ? ?Plan Discussed with: CRNA ? ?Anesthesia Plan Comments: (Lab Results ?     Component                Value               Date                 ?     PREGTESTUR               NEGATIVE            01/27/2022           ?Lab Results ?     Component                Value               Date                  ?     WBC                      15.0 (H)            01/27/2022           ?     HGB                      13.3                01/27/2022           ?     HCT                      41.9                01/27/2022           ?  MCV                      86.7                01/27/2022           ?     PLT                      324                 01/27/2022          )  ? ? ? ? ? ? ?Anesthesia Quick Evaluation ? ?

## 2022-01-27 NOTE — H&P (Signed)
? ?Admitting Physician: Hyman Hopes Lyrik Buresh ? ?Service: General Surgery ? ?CC: Abdominal pain ? ?Subjective  ? ?HPI: ?Mia Ortiz is an 31 y.o. female who is here for abdominal pain.  She has had two weeks of abdominal pain.  Yesterday was the worst.  It is located in the right upper quadrant.  She hasn't had issues like this in the past.  She has dealt with constipation before.  She hasn't noticed any foods in particular that don't agree with her. ? ?Past Medical History:  ?Diagnosis Date  ? Leukopenia 10/14/2013  ? Pre-diabetes   ? ? ?History reviewed. No pertinent surgical history. ? ?Family History  ?Problem Relation Age of Onset  ? Cancer Paternal Uncle   ?     lung cancer  ? Cancer Maternal Grandmother   ?     cervical cancer  ? Cancer Maternal Grandfather   ?     stomach ca  ? ? ?Social:  reports that she has quit smoking. Her smoking use included cigarettes. She has a 1.50 pack-year smoking history. She has never used smokeless tobacco. She reports that she does not drink alcohol and does not use drugs. ? ?Allergies:  ?Allergies  ?Allergen Reactions  ? Famotidine Other (See Comments)  ?  Headache  ? ? ?Medications: ?Current Outpatient Medications  ?Medication Instructions  ? APPLE CIDER VINEGAR PO 2 tablets, Oral, Nightly, gummies  ? buPROPion (WELLBUTRIN XL) 300 mg, Oral, As needed  ? diphenhydrAMINE (BENADRYL) 25 mg, Oral, Daily at bedtime  ? escitalopram (LEXAPRO) 10 mg, Oral, Daily  ? etonogestrel-ethinyl estradiol (NUVARING) 0.12-0.015 MG/24HR vaginal ring 1 each, Vaginal, Every 28 days  ? loratadine (CLARITIN) 10 mg, Oral, As needed  ? ? ?ROS - all of the below systems have been reviewed with the patient and positives are indicated with bold text ?General: chills, fever or night sweats ?Eyes: blurry vision or double vision ?ENT: epistaxis or sore throat ?Allergy/Immunology: itchy/watery eyes or nasal congestion ?Hematologic/Lymphatic: bleeding problems, blood clots or swollen lymph  nodes ?Endocrine: temperature intolerance or unexpected weight changes ?Breast: new or changing breast lumps or nipple discharge ?Resp: cough, shortness of breath, or wheezing ?CV: chest pain or dyspnea on exertion ?GI: as per HPI ?GU: dysuria, trouble voiding, or hematuria ?MSK: joint pain or joint stiffness ?Neuro: TIA or stroke symptoms ?Derm: pruritus and skin lesion changes ?Psych: anxiety and depression ? ?Objective  ? ?PE ?Blood pressure 112/70, pulse 96, temperature 99.3 ?F (37.4 ?C), resp. rate 16, SpO2 97 %. ?Constitutional: NAD; conversant; no deformities ?Eyes: Moist conjunctiva; no lid lag; anicteric; PERRL ?Neck: Trachea midline; no thyromegaly ?Lungs: Normal respiratory effort; no tactile fremitus ?CV: RRR; no palpable thrills; no pitting edema ?GI: Abd Soft, tender RUQ, ?MSK: Normal range of motion of extremities; no clubbing/cyanosis ?Psychiatric: Appropriate affect; alert and oriented x3 ?Lymphatic: No palpable cervical or axillary lymphadenopathy ? ?Results for orders placed or performed during the hospital encounter of 01/27/22 (from the past 24 hour(s))  ?Lipase, blood     Status: None  ? Collection Time: 01/27/22  9:19 AM  ?Result Value Ref Range  ? Lipase 13 11 - 51 U/L  ?Comprehensive metabolic panel     Status: Abnormal  ? Collection Time: 01/27/22  9:19 AM  ?Result Value Ref Range  ? Sodium 135 135 - 145 mmol/L  ? Potassium 3.9 3.5 - 5.1 mmol/L  ? Chloride 105 98 - 111 mmol/L  ? CO2 20 (L) 22 - 32 mmol/L  ? Glucose, Bld  103 (H) 70 - 99 mg/dL  ? BUN 12 6 - 20 mg/dL  ? Creatinine, Ser 0.65 0.44 - 1.00 mg/dL  ? Calcium 9.1 8.9 - 10.3 mg/dL  ? Total Protein 7.4 6.5 - 8.1 g/dL  ? Albumin 4.3 3.5 - 5.0 g/dL  ? AST 13 (L) 15 - 41 U/L  ? ALT 14 0 - 44 U/L  ? Alkaline Phosphatase 86 38 - 126 U/L  ? Total Bilirubin 0.6 0.3 - 1.2 mg/dL  ? GFR, Estimated >60 >60 mL/min  ? Anion gap 10 5 - 15  ?CBC     Status: Abnormal  ? Collection Time: 01/27/22  9:19 AM  ?Result Value Ref Range  ? WBC 15.0 (H) 4.0 -  10.5 K/uL  ? RBC 4.83 3.87 - 5.11 MIL/uL  ? Hemoglobin 13.3 12.0 - 15.0 g/dL  ? HCT 41.9 36.0 - 46.0 %  ? MCV 86.7 80.0 - 100.0 fL  ? MCH 27.5 26.0 - 34.0 pg  ? MCHC 31.7 30.0 - 36.0 g/dL  ? RDW 13.8 11.5 - 15.5 %  ? Platelets 324 150 - 400 K/uL  ? nRBC 0.0 0.0 - 0.2 %  ?Urinalysis, Routine w reflex microscopic Urine, Clean Catch     Status: Abnormal  ? Collection Time: 01/27/22  9:19 AM  ?Result Value Ref Range  ? Color, Urine YELLOW YELLOW  ? APPearance HAZY (A) CLEAR  ? Specific Gravity, Urine 1.031 (H) 1.005 - 1.030  ? pH 6.0 5.0 - 8.0  ? Glucose, UA NEGATIVE NEGATIVE mg/dL  ? Hgb urine dipstick NEGATIVE NEGATIVE  ? Bilirubin Urine NEGATIVE NEGATIVE  ? Ketones, ur NEGATIVE NEGATIVE mg/dL  ? Protein, ur 30 (A) NEGATIVE mg/dL  ? Nitrite NEGATIVE NEGATIVE  ? Leukocytes,Ua MODERATE (A) NEGATIVE  ? RBC / HPF 0-5 0 - 5 RBC/hpf  ? WBC, UA 21-50 0 - 5 WBC/hpf  ? Bacteria, UA FEW (A) NONE SEEN  ? Squamous Epithelial / LPF 6-10 0 - 5  ? Mucus PRESENT   ?Pregnancy, urine     Status: None  ? Collection Time: 01/27/22  9:19 AM  ?Result Value Ref Range  ? Preg Test, Ur NEGATIVE NEGATIVE  ?Resp Panel by RT-PCR (Flu A&B, Covid) Nasopharyngeal Swab     Status: None  ? Collection Time: 01/27/22 11:03 AM  ? Specimen: Nasopharyngeal Swab; Nasopharyngeal(NP) swabs in vial transport medium  ?Result Value Ref Range  ? SARS Coronavirus 2 by RT PCR NEGATIVE NEGATIVE  ? Influenza A by PCR NEGATIVE NEGATIVE  ? Influenza B by PCR NEGATIVE NEGATIVE  ? ? ?Imaging Orders    ?     US Abdomen Limited RUQ (LIVER/GB)    ?1. Cholelithiasis without sonographic evidence of acute cholecystitis. ? ?On my review, there is a 2 cm gallstone in the neck/infundibulum area of the gallbladder that is probably the cause of her pain. ? ? ? ?Assessment and Plan  ? ?Mia Ortiz is an 31 y.o. female with cholecystitis and a 2 cm stone on RUQ [place].  I recommended laparoscopic cholecystectomy.  The procedure itself as well as its risks, benefits and  alternatives were discussed with the patient in full who granted consent to proceed.  We will proceed to the OR as time allows. ? ?Quentin Ore, MD ? ?[facility] ?Use [email or web address] to contact on call provider ? ?New Patient Billing: ?[phone] - High MDM ?

## 2022-01-28 ENCOUNTER — Encounter (HOSPITAL_COMMUNITY): Payer: Self-pay | Admitting: Surgery

## 2022-01-31 LAB — SURGICAL PATHOLOGY

## 2022-02-01 DIAGNOSIS — F329 Major depressive disorder, single episode, unspecified: Secondary | ICD-10-CM | POA: Diagnosis not present

## 2022-02-08 ENCOUNTER — Other Ambulatory Visit: Payer: Self-pay

## 2022-02-08 ENCOUNTER — Ambulatory Visit (HOSPITAL_BASED_OUTPATIENT_CLINIC_OR_DEPARTMENT_OTHER)
Admission: RE | Admit: 2022-02-08 | Discharge: 2022-02-08 | Disposition: A | Discharge status: Home / Self Care | Payer: BC Managed Care – PPO | Source: Ambulatory Visit | Attending: Surgery | Admitting: Surgery

## 2022-02-08 ENCOUNTER — Encounter (HOSPITAL_BASED_OUTPATIENT_CLINIC_OR_DEPARTMENT_OTHER): Payer: Self-pay

## 2022-02-08 ENCOUNTER — Other Ambulatory Visit (HOSPITAL_COMMUNITY): Payer: Self-pay | Admitting: Surgery

## 2022-02-08 ENCOUNTER — Other Ambulatory Visit (HOSPITAL_BASED_OUTPATIENT_CLINIC_OR_DEPARTMENT_OTHER): Payer: Self-pay | Admitting: Surgery

## 2022-02-08 DIAGNOSIS — R1011 Right upper quadrant pain: Secondary | ICD-10-CM | POA: Insufficient documentation

## 2022-02-08 MED ORDER — IOHEXOL 300 MG/ML  SOLN
100.0000 mL | Freq: Once | INTRAMUSCULAR | Status: AC | PRN
  Administered 2022-02-08: 85 mL via INTRAVENOUS

## 2022-02-15 DIAGNOSIS — F329 Major depressive disorder, single episode, unspecified: Secondary | ICD-10-CM | POA: Diagnosis not present

## 2022-02-22 DIAGNOSIS — F329 Major depressive disorder, single episode, unspecified: Secondary | ICD-10-CM | POA: Diagnosis not present

## 2022-03-01 DIAGNOSIS — F329 Major depressive disorder, single episode, unspecified: Secondary | ICD-10-CM | POA: Diagnosis not present

## 2022-03-12 DIAGNOSIS — F4323 Adjustment disorder with mixed anxiety and depressed mood: Secondary | ICD-10-CM | POA: Diagnosis not present

## 2022-03-15 DIAGNOSIS — F4323 Adjustment disorder with mixed anxiety and depressed mood: Secondary | ICD-10-CM | POA: Diagnosis not present

## 2022-03-22 DIAGNOSIS — F4323 Adjustment disorder with mixed anxiety and depressed mood: Secondary | ICD-10-CM | POA: Diagnosis not present

## 2022-03-29 DIAGNOSIS — F4323 Adjustment disorder with mixed anxiety and depressed mood: Secondary | ICD-10-CM | POA: Diagnosis not present

## 2022-04-04 DIAGNOSIS — Z01419 Encounter for gynecological examination (general) (routine) without abnormal findings: Secondary | ICD-10-CM | POA: Diagnosis not present

## 2022-04-04 DIAGNOSIS — Z6831 Body mass index (BMI) 31.0-31.9, adult: Secondary | ICD-10-CM | POA: Diagnosis not present

## 2022-04-05 DIAGNOSIS — F4323 Adjustment disorder with mixed anxiety and depressed mood: Secondary | ICD-10-CM | POA: Diagnosis not present

## 2022-04-12 DIAGNOSIS — F4323 Adjustment disorder with mixed anxiety and depressed mood: Secondary | ICD-10-CM | POA: Diagnosis not present

## 2022-04-19 DIAGNOSIS — F4323 Adjustment disorder with mixed anxiety and depressed mood: Secondary | ICD-10-CM | POA: Diagnosis not present

## 2022-08-27 DIAGNOSIS — F322 Major depressive disorder, single episode, severe without psychotic features: Secondary | ICD-10-CM | POA: Diagnosis not present

## 2022-08-27 DIAGNOSIS — Z23 Encounter for immunization: Secondary | ICD-10-CM | POA: Diagnosis not present

## 2022-08-27 DIAGNOSIS — D72829 Elevated white blood cell count, unspecified: Secondary | ICD-10-CM | POA: Diagnosis not present

## 2022-08-27 DIAGNOSIS — E7800 Pure hypercholesterolemia, unspecified: Secondary | ICD-10-CM | POA: Diagnosis not present

## 2022-08-27 DIAGNOSIS — F419 Anxiety disorder, unspecified: Secondary | ICD-10-CM | POA: Diagnosis not present

## 2022-08-27 DIAGNOSIS — E669 Obesity, unspecified: Secondary | ICD-10-CM | POA: Diagnosis not present

## 2022-08-27 DIAGNOSIS — Z7289 Other problems related to lifestyle: Secondary | ICD-10-CM | POA: Diagnosis not present

## 2022-08-27 DIAGNOSIS — Z0000 Encounter for general adult medical examination without abnormal findings: Secondary | ICD-10-CM | POA: Diagnosis not present

## 2022-09-27 DIAGNOSIS — G4700 Insomnia, unspecified: Secondary | ICD-10-CM | POA: Diagnosis not present

## 2022-09-27 DIAGNOSIS — D72829 Elevated white blood cell count, unspecified: Secondary | ICD-10-CM | POA: Diagnosis not present

## 2022-10-09 DIAGNOSIS — Z03818 Encounter for observation for suspected exposure to other biological agents ruled out: Secondary | ICD-10-CM | POA: Diagnosis not present

## 2022-10-09 DIAGNOSIS — R52 Pain, unspecified: Secondary | ICD-10-CM | POA: Diagnosis not present

## 2022-10-09 DIAGNOSIS — B349 Viral infection, unspecified: Secondary | ICD-10-CM | POA: Diagnosis not present

## 2022-10-09 DIAGNOSIS — R509 Fever, unspecified: Secondary | ICD-10-CM | POA: Diagnosis not present

## 2022-12-12 DIAGNOSIS — Z3202 Encounter for pregnancy test, result negative: Secondary | ICD-10-CM | POA: Diagnosis not present

## 2022-12-12 DIAGNOSIS — R300 Dysuria: Secondary | ICD-10-CM | POA: Diagnosis not present

## 2022-12-12 DIAGNOSIS — Z3040 Encounter for surveillance of contraceptives, unspecified: Secondary | ICD-10-CM | POA: Diagnosis not present

## 2022-12-12 DIAGNOSIS — Z87440 Personal history of urinary (tract) infections: Secondary | ICD-10-CM | POA: Diagnosis not present

## 2022-12-17 ENCOUNTER — Emergency Department (HOSPITAL_BASED_OUTPATIENT_CLINIC_OR_DEPARTMENT_OTHER): Payer: BC Managed Care – PPO

## 2022-12-17 ENCOUNTER — Emergency Department (HOSPITAL_BASED_OUTPATIENT_CLINIC_OR_DEPARTMENT_OTHER)
Admission: EM | Admit: 2022-12-17 | Discharge: 2022-12-17 | Disposition: A | Discharge status: Home / Self Care | Payer: BC Managed Care – PPO | Attending: Emergency Medicine | Admitting: Emergency Medicine

## 2022-12-17 ENCOUNTER — Encounter (HOSPITAL_BASED_OUTPATIENT_CLINIC_OR_DEPARTMENT_OTHER): Payer: Self-pay | Admitting: Radiology

## 2022-12-17 ENCOUNTER — Other Ambulatory Visit: Payer: Self-pay

## 2022-12-17 DIAGNOSIS — D72829 Elevated white blood cell count, unspecified: Secondary | ICD-10-CM | POA: Diagnosis not present

## 2022-12-17 DIAGNOSIS — N83201 Unspecified ovarian cyst, right side: Secondary | ICD-10-CM

## 2022-12-17 DIAGNOSIS — R739 Hyperglycemia, unspecified: Secondary | ICD-10-CM | POA: Diagnosis not present

## 2022-12-17 DIAGNOSIS — R1031 Right lower quadrant pain: Secondary | ICD-10-CM | POA: Diagnosis not present

## 2022-12-17 DIAGNOSIS — E871 Hypo-osmolality and hyponatremia: Secondary | ICD-10-CM | POA: Insufficient documentation

## 2022-12-17 DIAGNOSIS — R9431 Abnormal electrocardiogram [ECG] [EKG]: Secondary | ICD-10-CM | POA: Diagnosis not present

## 2022-12-17 DIAGNOSIS — R109 Unspecified abdominal pain: Secondary | ICD-10-CM | POA: Diagnosis not present

## 2022-12-17 DIAGNOSIS — R1013 Epigastric pain: Secondary | ICD-10-CM | POA: Diagnosis not present

## 2022-12-17 DIAGNOSIS — K760 Fatty (change of) liver, not elsewhere classified: Secondary | ICD-10-CM | POA: Diagnosis not present

## 2022-12-17 DIAGNOSIS — R197 Diarrhea, unspecified: Secondary | ICD-10-CM | POA: Diagnosis not present

## 2022-12-17 LAB — COMPREHENSIVE METABOLIC PANEL
ALT: 51 U/L — ABNORMAL HIGH (ref 0–44)
AST: 45 U/L — ABNORMAL HIGH (ref 15–41)
Albumin: 4.5 g/dL (ref 3.5–5.0)
Alkaline Phosphatase: 89 U/L (ref 38–126)
Anion gap: 12 (ref 5–15)
BUN: 11 mg/dL (ref 6–20)
CO2: 21 mmol/L — ABNORMAL LOW (ref 22–32)
Calcium: 9.5 mg/dL (ref 8.9–10.3)
Chloride: 101 mmol/L (ref 98–111)
Creatinine, Ser: 0.64 mg/dL (ref 0.44–1.00)
GFR, Estimated: >60 mL/min (ref >60)
Glucose, Bld: 136 mg/dL — ABNORMAL HIGH (ref 70–99)
Potassium: 3.5 mmol/L (ref 3.5–5.1)
Sodium: 134 mmol/L — ABNORMAL LOW (ref 135–145)
Total Bilirubin: 0.6 mg/dL (ref 0.3–1.2)
Total Protein: 7.9 g/dL (ref 6.5–8.1)

## 2022-12-17 LAB — URINALYSIS, ROUTINE W REFLEX MICROSCOPIC
Bacteria, UA: NONE SEEN
Bilirubin Urine: NEGATIVE
Glucose, UA: NEGATIVE mg/dL
Hgb urine dipstick: NEGATIVE
Ketones, ur: NEGATIVE mg/dL
Nitrite: NEGATIVE
Protein, ur: 30 mg/dL — AB
Specific Gravity, Urine: 1.034 — ABNORMAL HIGH (ref 1.005–1.030)
pH: 5.5 (ref 5.0–8.0)

## 2022-12-17 LAB — CBC
HCT: 40.7 % (ref 36.0–46.0)
Hemoglobin: 13.6 g/dL (ref 12.0–15.0)
MCH: 28.5 pg (ref 26.0–34.0)
MCHC: 33.4 g/dL (ref 30.0–36.0)
MCV: 85.1 fL (ref 80.0–100.0)
Platelets: 331 10*3/uL (ref 150–400)
RBC: 4.78 MIL/uL (ref 3.87–5.11)
RDW: 13.3 % (ref 11.5–15.5)
WBC: 11.4 10*3/uL — ABNORMAL HIGH (ref 4.0–10.5)
nRBC: 0 % (ref 0.0–0.2)

## 2022-12-17 LAB — LIPASE, BLOOD: Lipase: 10 U/L — ABNORMAL LOW (ref 11–51)

## 2022-12-17 LAB — PREGNANCY, URINE: Preg Test, Ur: NEGATIVE

## 2022-12-17 MED ORDER — KETOROLAC TROMETHAMINE 30 MG/ML IJ SOLN
30.0000 mg | Freq: Once | INTRAMUSCULAR | Status: AC
  Administered 2022-12-17: 30 mg via INTRAVENOUS
  Filled 2022-12-17 (×2): qty 1

## 2022-12-17 MED ORDER — HYDROCODONE-ACETAMINOPHEN 5-325 MG PO TABS: 2.0000 | ORAL_TABLET | ORAL | 0 refills | Status: DC | PRN

## 2022-12-17 MED ORDER — ONDANSETRON HCL 4 MG PO TABS: 4.0000 mg | ORAL_TABLET | Freq: Four times a day (QID) | ORAL | 0 refills | Status: DC

## 2022-12-17 MED ORDER — SODIUM CHLORIDE 0.9 % IV BOLUS
1000.0000 mL | Freq: Once | INTRAVENOUS | Status: AC
  Administered 2022-12-17: 1000 mL via INTRAVENOUS

## 2022-12-17 MED ORDER — MORPHINE SULFATE (PF) 4 MG/ML IV SOLN
4.0000 mg | Freq: Once | INTRAVENOUS | Status: AC
  Administered 2022-12-17: 4 mg via INTRAVENOUS
  Filled 2022-12-17: qty 1

## 2022-12-17 MED ORDER — ONDANSETRON HCL 4 MG/2ML IJ SOLN
4.0000 mg | Freq: Once | INTRAMUSCULAR | Status: AC
  Administered 2022-12-17: 4 mg via INTRAVENOUS
  Filled 2022-12-17: qty 2

## 2022-12-17 MED ORDER — IOHEXOL 300 MG/ML  SOLN
100.0000 mL | Freq: Once | INTRAMUSCULAR | Status: AC | PRN
  Administered 2022-12-17: 85 mL via INTRAVENOUS

## 2022-12-17 MED ORDER — HYDROMORPHONE HCL 1 MG/ML IJ SOLN
1.0000 mg | Freq: Once | INTRAMUSCULAR | Status: AC
  Administered 2022-12-17: 1 mg via INTRAVENOUS
  Filled 2022-12-17 (×2): qty 1

## 2022-12-17 NOTE — ED Triage Notes (Addendum)
Patient arrives with complaints of abdominal pain, bloating, diarrhea x1 week. Patient was seen by her obgyn last week for the same(neg for pregnancy and UTI). States that symptoms have worsened over the past two days.  Had her gallbladder removed last year.   Rates abdominal pain an 8/10.

## 2022-12-17 NOTE — Discharge Instructions (Addendum)
Evaluation today revealed that you do have a right ovarian cyst.  This is likely causing your abdominal pain and associated nausea.  At this time treatment is pain control.  I have sent Norco to your pharmacy for acute pain.  Otherwise recommend ibuprofen or Aleve for further pain control if needed.  Recommend that you follow-up with your OB/GYN for the ovarian cyst.  It is at risk for rupture if you have new shortness of breath, fatigue, lightheadedness or passout at home or any other concerning symptom please return to the emergency department for further evaluation.

## 2022-12-17 NOTE — ED Provider Notes (Signed)
[facility] Provider Note   CSN: [id number] Arrival date & time: 12/17/22  1058     History  Chief Complaint  Patient presents with   Abdominal Pain   Nausea   HPI Mia Ortiz is a 32 y.o. female with history of leukopenia and cholecystectomy in 2023 presenting for abdominal pain.  Started last week but has worsened in the last day.  Now endorsing severe pain in the epigastric region that radiates to her right side and back.  Also endorsing associated abdominal distention.  States urine output has been reduced in the last few days.  Urine is orange-tinged.  Denies painful urination or malodorous urine or hematuria.  Vomited x 1 yesterday and also endorses diarrhea.  Output is nonbloody.  Was seen for this complaint about a week ago by her OB/GYN provider.  Pregnancy was negative and urinalysis did not reveal concern for UTI.  Endorses chills but no fever.   Abdominal Pain      Home Medications Prior to Admission medications   Medication Sig Start Date End Date Taking? Authorizing Provider  ondansetron (ZOFRAN) 4 MG tablet Take 1 tablet (4 mg total) by mouth every 6 (six) hours. 12/17/22  Yes Harriet Pho, PA-C  APPLE CIDER VINEGAR PO Take 2 tablets by mouth at bedtime. gummies    [provider]  buPROPion (WELLBUTRIN XL) 300 MG 24 hr tablet Take 300 mg by mouth as needed. 02/21/21   [provider]  diphenhydrAMINE (BENADRYL) 25 MG tablet Take 25 mg by mouth at bedtime.    [provider]  escitalopram (LEXAPRO) 10 MG tablet Take 10 mg by mouth daily. 05/05/21   [provider]  etonogestrel-ethinyl estradiol (NUVARING) 0.12-0.015 MG/24HR vaginal ring Place 1 each vaginally every 28 (twenty-eight) days.    [provider]  HYDROcodone-acetaminophen (NORCO/VICODIN) 5-325 MG tablet Take 2 tablets by mouth every 4 (four) hours as needed. 12/17/22  Yes Harriet Pho, PA-C  hydrOXYzine  (ATARAX) 25 MG tablet Take 25 mg by mouth daily. 10/17/22   [provider]  loratadine (CLARITIN) 10 MG tablet Take 10 mg by mouth as needed for allergies.    [provider]  MILI 0.25-35 MG-MCG tablet Take 1 tablet by mouth daily. 12/12/22   [provider]  oxyCODONE-acetaminophen (PERCOCET) 5-325 MG tablet Take 1 tablet by mouth every 4 (four) hours as needed for severe pain. 01/27/22 01/27/23  Stechschulte, Nickola Major, MD      Allergies    Famotidine    Review of Systems   Review of Systems  Gastrointestinal:  Positive for abdominal pain.    Physical Exam   Vitals:   12/17/22 1230 12/17/22 1300  BP: 119/84 115/77  Pulse: 78 72  Resp: 12 18  Temp:    SpO2: 100% 100%    CONSTITUTIONAL:  well-appearing, NAD NEURO:  Alert and oriented x 3, CN 3-12 grossly intact EYES:  eyes equal and reactive ENT/NECK:  Supple, no stridor  CARDIO:  regular rate and rhythm, appears well-perfused  PULM:  No respiratory distress, CTAB GI/GU: Abdomen is protuberant, soft with RLQ tenderness MSK/SPINE:  No gross deformities, no edema, moves all extremities SKIN:  no rash, atraumatic   *Additional and/or pertinent findings included in MDM below    ED Results / Procedures / Treatments   Labs (all labs ordered are listed, but only abnormal results are displayed) Labs Reviewed  LIPASE, BLOOD - Abnormal; Notable for the following components:  Result Value   Lipase 10 (*)    All other components within normal limits  COMPREHENSIVE METABOLIC PANEL - Abnormal; Notable for the following components:   Sodium 134 (*)    CO2 21 (*)    Glucose, Bld 136 (*)    AST 45 (*)    ALT 51 (*)    All other components within normal limits  CBC - Abnormal; Notable for the following components:   WBC 11.4 (*)    All other components within normal limits  URINALYSIS, ROUTINE W REFLEX MICROSCOPIC - Abnormal; Notable for the following components:   APPearance HAZY (*)    Specific  Gravity, Urine 1.034 (*)    Protein, ur 30 (*)    Leukocytes,Ua SMALL (*)    All other components within normal limits  PREGNANCY, URINE    EKG EKG Interpretation  Date/Time:  Monday December 17 2022 12:49:12 EST Ventricular Rate:  62 PR Interval:  151 QRS Duration: 95 QT Interval:  408 QTC Calculation: 415 R Axis:   16 Text Interpretation: Sinus rhythm Borderline T abnormalities, anterior leads No previous ECGs available Confirmed by Glynn Octave [phone]) on 12/17/2022 12:52:45 PM  Radiology US PELVIC COMPLETE W TRANSVAGINAL AND TORSION R/O  Result Date: 12/17/2022 CLINICAL DATA:  Right lower quadrant pain.  Abnormal CT EXAM: TRANSABDOMINAL AND TRANSVAGINAL ULTRASOUND OF PELVIS DOPPLER ULTRASOUND OF OVARIES TECHNIQUE: Both transabdominal and transvaginal ultrasound examinations of the pelvis were performed. Transabdominal technique was performed for global imaging of the pelvis including uterus, ovaries, adnexal regions, and pelvic cul-de-sac. It was necessary to proceed with endovaginal exam following the transabdominal exam to visualize the adnexa. Color and duplex Doppler ultrasound was utilized to evaluate blood flow to the ovaries. COMPARISON:  Same day CT.  Ultrasound 08/23/2012 FINDINGS: Uterus Measurements: 5.1 x 3.6 x 5.2 cm = volume: 50 mL. No fibroids or other mass visualized. Endometrium Thickness: 12 mm.  No focal abnormality visualized. Right ovary Measurements: 4.3 x 4.5 x 3.5 cm = volume: 35 mL. Cyst with thin internal septation in the right ovary measuring up to 3.5 cm. Adjacent simple right ovarian cyst measuring up to 3.7 cm. Left ovary Measurements: 2.5 x 1.1 x 1.2 cm = volume: 2 mL. Normal appearance/no adnexal mass. Pulsed Doppler evaluation of both ovaries demonstrates normal low-resistance arterial and venous waveforms. Other findings Trace free fluid within the pelvis. IMPRESSION: 1. No evidence of adnexal torsion. 2. Right ovarian cysts measuring up to 3.5 cm. These  are almost certainly benign, and no further follow-up is required. 3. Trace free fluid within the pelvis, likely physiologic. Electronically Signed   By: Duanne Guess D.O.   On: 12/17/2022 13:50   CT Abdomen Pelvis W Contrast  Result Date: 12/17/2022 CLINICAL DATA:  Right lower quadrant abdominal pain. EXAM: CT ABDOMEN AND PELVIS WITH CONTRAST TECHNIQUE: Multidetector CT imaging of the abdomen and pelvis was performed using the standard protocol following bolus administration of intravenous contrast. RADIATION DOSE REDUCTION: This exam was performed according to the departmental dose-optimization program which includes automated exposure control, adjustment of the mA and/or kV according to patient size and/or use of iterative reconstruction technique. CONTRAST:  47mL OMNIPAQUE IOHEXOL 300 MG/ML  SOLN COMPARISON:  CT February 08, 2022. FINDINGS: Lower chest: No acute abnormality. Hepatobiliary: Diffuse hepatic steatosis. Gallbladder surgically absent. No biliary ductal dilation. Pancreas: No pancreatic ductal dilation or evidence of acute inflammation. Spleen: No splenomegaly or focal splenic lesion. Adrenals/Urinary Tract: Bilateral adrenal glands appear normal. No hydronephrosis. Kidneys demonstrate symmetric  enhancement. Stomach/Bowel: Stomach is distended with ingested material and gas without focal wall thickening. Pathologic dilation of small or large bowel. Normal appendix. Terminal ileum appears normal. No evidence of acute bowel inflammation. Vascular/Lymphatic: Normal caliber abdominal aorta. Smooth IVC contours. No pathologically enlarged abdominal or pelvic lymph nodes Reproductive: 3.6 cm right ovarian cyst, considered benign requiring no independent imaging follow-up. Adjacent fluid-filled tubular serpiginous structure likely reflecting hydrosalpinx. Uterus is unremarkable. Left adnexa appears normal. Other: No significant abdominopelvic free fluid. Musculoskeletal: Right acetabular bone island. No  acute osseous abnormality. IMPRESSION: 1. 3.6 cm right ovarian cyst with adjacent fluid-filled tubular serpiginous structure likely reflecting hydrosalpinx. Consider further evaluation pelvic ultrasound. 2. Diffuse hepatic steatosis. Electronically Signed   By: Dahlia Bailiff M.D.   On: 12/17/2022 12:38    Procedures Procedures    Medications Ordered in ED Medications  sodium chloride 0.9 % bolus 1,000 mL (1,000 mLs Intravenous New Bag/Given 12/17/22 1210)  ondansetron (ZOFRAN) injection 4 mg (4 mg Intravenous Given 12/17/22 1211)  morphine (PF) 4 MG/ML injection 4 mg (4 mg Intravenous Given 12/17/22 1211)  iohexol (OMNIPAQUE) 300 MG/ML solution 100 mL (85 mLs Intravenous Contrast Given 12/17/22 1218)  HYDROmorphone (DILAUDID) injection 1 mg (1 mg Intravenous Given 12/17/22 1342)  ketorolac (TORADOL) 30 MG/ML injection 30 mg (30 mg Intravenous Given 12/17/22 1341)    ED Course/ Medical Decision Making/ A&P                             Medical Decision Making Amount and/or Complexity of Data Reviewed Labs: ordered. Radiology: ordered.  Risk Prescription drug management.   Initial Impression and Ddx 32 year old who is well-appearing and hemodynamically stable presenting for abdominal pain.  Physical exam was notable for right lower quadrant tenderness.  Differential diagnosis for this complaint includes ectopic pregnancy, ovarian torsion, appendicitis, and diverticulitis, and colitis and nephrolithiasis. Patient PMH that increases complexity of ED encounter: None  Interpretation of Diagnostics I independent reviewed and interpreted the labs as followed: Hyponatremia, leukocytosis, hyperglycemia, elevated AST and ALT  - I independently visualized the following imaging with scope of interpretation limited to determining acute life threatening conditions related to emergency care: CT abdomen pelvis consistent this prompted further evaluation with ultrasound which was negative for torsion and  without evidence of rupture.   Patient Reassessment and Ultimate Disposition/Management Volume resuscitated with normal saline bolus.  Treated nausea with Zofran.  Treated pain with morphine initially then Dilaudid and Toradol.  Patient stated that her symptoms felt much better on reevaluation.  CT scan and ultrasound revealed concern for right ovarian cyst.  This is likely the cause of her symptoms today.  Sent home with a few days of Norco for pain and advised to follow-up with her OB/GYN.  Also sent Zofran to her pharmacy for nausea at home.  Discussed appropriate return cautions.  Patient management required discussion with the following services or consulting groups:  None  Complexity of Problems Addressed Acute complicated illness or Injury  Additional Data Reviewed and Analyzed Further history obtained from: None  Patient Encounter Risk Assessment Prescriptions         Final Clinical Impression(s) / ED Diagnoses Final diagnoses:  Right ovarian cyst    Rx / DC Orders ED Discharge Orders          Ordered    HYDROcodone-acetaminophen (NORCO/VICODIN) 5-325 MG tablet  Every 4 hours PRN        12/17/22 1426  ondansetron (ZOFRAN) 4 MG tablet  Every 6 hours        12/17/22 1427              Vaughan Browner 12/17/22 1429    Glynn Octave, MD 12/17/22 Rickey Primus

## 2022-12-17 NOTE — ED Notes (Signed)
Patient transported to CT 

## 2022-12-18 ENCOUNTER — Telehealth (HOSPITAL_BASED_OUTPATIENT_CLINIC_OR_DEPARTMENT_OTHER): Payer: Self-pay

## 2022-12-18 ENCOUNTER — Telehealth (HOSPITAL_BASED_OUTPATIENT_CLINIC_OR_DEPARTMENT_OTHER): Payer: Self-pay | Admitting: Emergency Medicine

## 2022-12-18 MED ORDER — KETOROLAC TROMETHAMINE 10 MG PO TABS
10.0000 mg | ORAL_TABLET | Freq: Four times a day (QID) | ORAL | 0 refills | Status: DC | PRN
Start: 2022-12-18 — End: 2023-07-09

## 2022-12-18 NOTE — Telephone Encounter (Signed)
Patient called having itching to Norco.  I did not physically see this patient in the hospital.  I will not send another prescription for narcotic.  Advise she take Benadryl and will give prescription for p.o. Toradol.

## 2022-12-19 ENCOUNTER — Emergency Department (HOSPITAL_BASED_OUTPATIENT_CLINIC_OR_DEPARTMENT_OTHER)
Admission: EM | Admit: 2022-12-19 | Discharge: 2022-12-19 | Disposition: A | Discharge status: Home / Self Care | Payer: BC Managed Care – PPO | Attending: Emergency Medicine | Admitting: Emergency Medicine

## 2022-12-19 ENCOUNTER — Encounter (HOSPITAL_BASED_OUTPATIENT_CLINIC_OR_DEPARTMENT_OTHER): Payer: Self-pay

## 2022-12-19 ENCOUNTER — Other Ambulatory Visit: Payer: Self-pay

## 2022-12-19 ENCOUNTER — Emergency Department (HOSPITAL_BASED_OUTPATIENT_CLINIC_OR_DEPARTMENT_OTHER): Payer: BC Managed Care – PPO | Admitting: Radiology

## 2022-12-19 DIAGNOSIS — Z20822 Contact with and (suspected) exposure to covid-19: Secondary | ICD-10-CM | POA: Diagnosis not present

## 2022-12-19 DIAGNOSIS — R062 Wheezing: Secondary | ICD-10-CM | POA: Diagnosis not present

## 2022-12-19 DIAGNOSIS — R1031 Right lower quadrant pain: Secondary | ICD-10-CM | POA: Insufficient documentation

## 2022-12-19 DIAGNOSIS — R0789 Other chest pain: Secondary | ICD-10-CM | POA: Insufficient documentation

## 2022-12-19 DIAGNOSIS — F17200 Nicotine dependence, unspecified, uncomplicated: Secondary | ICD-10-CM | POA: Insufficient documentation

## 2022-12-19 DIAGNOSIS — R0602 Shortness of breath: Secondary | ICD-10-CM | POA: Diagnosis not present

## 2022-12-19 LAB — CBC
HCT: 37.5 % (ref 36.0–46.0)
Hemoglobin: 12.4 g/dL (ref 12.0–15.0)
MCH: 28.5 pg (ref 26.0–34.0)
MCHC: 33.1 g/dL (ref 30.0–36.0)
MCV: 86.2 fL (ref 80.0–100.0)
Platelets: 257 10*3/uL (ref 150–400)
RBC: 4.35 MIL/uL (ref 3.87–5.11)
RDW: 13.3 % (ref 11.5–15.5)
WBC: 8.3 10*3/uL (ref 4.0–10.5)
nRBC: 0 % (ref 0.0–0.2)

## 2022-12-19 LAB — BASIC METABOLIC PANEL
Anion gap: 10 (ref 5–15)
BUN: 10 mg/dL (ref 6–20)
CO2: 23 mmol/L (ref 22–32)
Calcium: 8.8 mg/dL — ABNORMAL LOW (ref 8.9–10.3)
Chloride: 104 mmol/L (ref 98–111)
Creatinine, Ser: 0.66 mg/dL (ref 0.44–1.00)
GFR, Estimated: >60 mL/min (ref >60)
Glucose, Bld: 87 mg/dL (ref 70–99)
Potassium: 4 mmol/L (ref 3.5–5.1)
Sodium: 137 mmol/L (ref 135–145)

## 2022-12-19 LAB — RESP PANEL BY RT-PCR (RSV, FLU A&B, COVID)  RVPGX2
Influenza A by PCR: NEGATIVE
Influenza B by PCR: NEGATIVE
Resp Syncytial Virus by PCR: NEGATIVE
SARS Coronavirus 2 by RT PCR: NEGATIVE

## 2022-12-19 LAB — TROPONIN I (HIGH SENSITIVITY): Troponin I (High Sensitivity): <2 ng/L (ref <18)

## 2022-12-19 MED ORDER — IPRATROPIUM-ALBUTEROL 0.5-2.5 (3) MG/3ML IN SOLN
3.0000 mL | Freq: Once | RESPIRATORY_TRACT | Status: AC
  Administered 2022-12-19: 3 mL via RESPIRATORY_TRACT
  Filled 2022-12-19: qty 3

## 2022-12-19 MED ORDER — ALBUTEROL SULFATE HFA 108 (90 BASE) MCG/ACT IN AERS
1.0000 | INHALATION_SPRAY | RESPIRATORY_TRACT | Status: DC | PRN
  Administered 2022-12-19: 2 via RESPIRATORY_TRACT
  Filled 2022-12-19: qty 6.7

## 2022-12-19 MED ORDER — DIPHENHYDRAMINE HCL 25 MG PO CAPS
25.0000 mg | ORAL_CAPSULE | Freq: Once | ORAL | Status: AC
  Administered 2022-12-19: 25 mg via ORAL
  Filled 2022-12-19: qty 1

## 2022-12-19 MED ORDER — OXYCODONE-ACETAMINOPHEN 5-325 MG PO TABS
1.0000 | ORAL_TABLET | Freq: Once | ORAL | Status: AC
  Administered 2022-12-19: 1 via ORAL
  Filled 2022-12-19: qty 1

## 2022-12-19 NOTE — ED Notes (Signed)
Discharge paperwork given and verbally understood. 

## 2022-12-19 NOTE — ED Provider Notes (Signed)
[facility] Provider Note   CSN: [id number] Arrival date & time: 12/19/22  1033     History  Chief Complaint  Patient presents with   Shortness of Dawson is a 32 y.o. female.   Shortness of Breath   32 year old female presents emergency department with complaints of shortness of breath, chest tightness.  Patient reports symptom onset this morning after awakening.  States that she was recently seen emergency department with abdominal pain and diagnosed with right-sided ovarian cyst.  States that she was prescribed Norco for pain of which cause itchiness at home; she was subsequently prescribed Toradol which she took last night and awoken with chest tightness this morning.  Denies history of similar symptoms in the past.  Requesting different medication for pain at home.  Describes chest tightness is worsened with physical pressure.  Denies fever, chills, night sweats, cough, congestion.  States that abdominal pain is improved somewhat she has had some feelings of nausea without home Zofran.  Denies urinary/vaginal symptoms, change in bowel habits.  Denies history of DVTs PE, recent surgery/immobilization/travel, current hormonal therapy, known malignancy.  Past medical history significant for cholecystectomy, leukopenia, prediabetes  Home Medications Prior to Admission medications   Medication Sig Start Date End Date Taking? Authorizing Provider  APPLE CIDER VINEGAR PO Take 2 tablets by mouth at bedtime. gummies    [provider]  buPROPion (WELLBUTRIN XL) 300 MG 24 hr tablet Take 300 mg by mouth as needed. 02/21/21   [provider]  diphenhydrAMINE (BENADRYL) 25 MG tablet Take 25 mg by mouth at bedtime.    [provider]  escitalopram (LEXAPRO) 10 MG tablet Take 10 mg by mouth daily. 05/05/21   [provider]  etonogestrel-ethinyl estradiol (NUVARING) 0.12-0.015 MG/24HR vaginal ring  Place 1 each vaginally every 28 (twenty-eight) days.    [provider]  HYDROcodone-acetaminophen (NORCO/VICODIN) 5-325 MG tablet Take 2 tablets by mouth every 4 (four) hours as needed. 12/17/22   Harriet Pho, PA-C  hydrOXYzine (ATARAX) 25 MG tablet Take 25 mg by mouth daily. 10/17/22   [provider]  ketorolac (TORADOL) 10 MG tablet Take 1 tablet (10 mg total) by mouth every 6 (six) hours as needed. 12/18/22   Elgie Congo, MD  loratadine (CLARITIN) 10 MG tablet Take 10 mg by mouth as needed for allergies.    [provider]  MILI 0.25-35 MG-MCG tablet Take 1 tablet by mouth daily. 12/12/22   [provider]  ondansetron (ZOFRAN) 4 MG tablet Take 1 tablet (4 mg total) by mouth every 6 (six) hours. 12/17/22   Harriet Pho, PA-C  oxyCODONE-acetaminophen (PERCOCET) 5-325 MG tablet Take 1 tablet by mouth every 4 (four) hours as needed for severe pain. 01/27/22 01/27/23  Stechschulte, Nickola Major, MD      Allergies    Famotidine    Review of Systems   Review of Systems  Respiratory:  Positive for shortness of breath.   All other systems reviewed and are negative.   Physical Exam Updated Vital Signs BP 106/73 (BP Location: Right Arm)   Pulse 89   Temp 98 F (36.7 C)   Resp 16   Ht 5\' 6"  (1.676 m)   Wt 103.4 kg   LMP 12/03/2022 Comment: oral contraceptives  SpO2 99%   BMI 36.79 kg/m  Physical Exam Vitals and nursing note reviewed.  Constitutional:      General: She is not in  acute distress.    Appearance: She is well-developed.  HENT:     Head: Normocephalic and atraumatic.  Eyes:     Conjunctiva/sclera: Conjunctivae normal.  Cardiovascular:     Rate and Rhythm: Normal rate and regular rhythm.     Heart sounds: No murmur heard. Pulmonary:     Effort: Pulmonary effort is normal. No respiratory distress.     Breath sounds: No rhonchi or rales.     Comments: Anterior chest wall tenderness.  Very mild wheeze auscultated upper lung fields  bilaterally. Chest:     Chest wall: Tenderness present.  Abdominal:     Palpations: Abdomen is soft.     Comments: Mild right lower quadrant tenderness to palpation of which she states is similar in nature to visit 2 days ago  Musculoskeletal:        General: No swelling.     Cervical back: Neck supple.     Right lower leg: No edema.     Left lower leg: No edema.  Skin:    General: Skin is warm and dry.     Capillary Refill: Capillary refill takes less than 2 seconds.  Neurological:     Mental Status: She is alert.  Psychiatric:        Mood and Affect: Mood normal.     ED Results / Procedures / Treatments   Labs (all labs ordered are listed, but only abnormal results are displayed) Labs Reviewed  BASIC METABOLIC PANEL - Abnormal; Notable for the following components:      Result Value   Calcium 8.8 (*)    All other components within normal limits  RESP PANEL BY RT-PCR (RSV, FLU A&B, COVID)  [id number]  CBC  TROPONIN I (HIGH SENSITIVITY)    EKG EKG Interpretation  Date/Time:  Wednesday December 19 2022 10:38:54 EST Ventricular Rate:  81 PR Interval:  152 QRS Duration: 72 QT Interval:  388 QTC Calculation: 450 R Axis:   69 Text Interpretation: Normal sinus rhythm Low voltage QRS Borderline ECG When compared with ECG of 17-Dec-2022 12:49, PREVIOUS ECG IS PRESENT when cmpareed to prior, overall similar appearance. no STEMI Confirmed by Theda Belfast ([id number]) on 12/19/2022 10:43:03 AM  Radiology DG Chest 2 View  Result Date: 12/19/2022 CLINICAL DATA:  Chest pain, chest tightness. EXAM: CHEST - 2 VIEW COMPARISON:  Chest radiograph 08/13/2012 FINDINGS: The cardiomediastinal contours are within normal limits. The lungs are clear. No pneumothorax or pleural effusion. No acute finding in the visualized skeleton. IMPRESSION: No active cardiopulmonary disease. Electronically Signed   By: Emmaline Kluver M.D.   On: 12/19/2022 11:25   US PELVIC COMPLETE W TRANSVAGINAL AND TORSION  R/O  Result Date: 12/17/2022 CLINICAL DATA:  Right lower quadrant pain.  Abnormal CT EXAM: TRANSABDOMINAL AND TRANSVAGINAL ULTRASOUND OF PELVIS DOPPLER ULTRASOUND OF OVARIES TECHNIQUE: Both transabdominal and transvaginal ultrasound examinations of the pelvis were performed. Transabdominal technique was performed for global imaging of the pelvis including uterus, ovaries, adnexal regions, and pelvic cul-de-sac. It was necessary to proceed with endovaginal exam following the transabdominal exam to visualize the adnexa. Color and duplex Doppler ultrasound was utilized to evaluate blood flow to the ovaries. COMPARISON:  Same day CT.  Ultrasound 08/23/2012 FINDINGS: Uterus Measurements: 5.1 x 3.6 x 5.2 cm = volume: 50 mL. No fibroids or other mass visualized. Endometrium Thickness: 12 mm.  No focal abnormality visualized. Right ovary Measurements: 4.3 x 4.5 x 3.5 cm = volume: 35 mL. Cyst with thin internal septation in the right  ovary measuring up to 3.5 cm. Adjacent simple right ovarian cyst measuring up to 3.7 cm. Left ovary Measurements: 2.5 x 1.1 x 1.2 cm = volume: 2 mL. Normal appearance/no adnexal mass. Pulsed Doppler evaluation of both ovaries demonstrates normal low-resistance arterial and venous waveforms. Other findings Trace free fluid within the pelvis. IMPRESSION: 1. No evidence of adnexal torsion. 2. Right ovarian cysts measuring up to 3.5 cm. These are almost certainly benign, and no further follow-up is required. 3. Trace free fluid within the pelvis, likely physiologic. Electronically Signed   By: Duanne Guess D.O.   On: 12/17/2022 13:50   CT Abdomen Pelvis W Contrast  Result Date: 12/17/2022 CLINICAL DATA:  Right lower quadrant abdominal pain. EXAM: CT ABDOMEN AND PELVIS WITH CONTRAST TECHNIQUE: Multidetector CT imaging of the abdomen and pelvis was performed using the standard protocol following bolus administration of intravenous contrast. RADIATION DOSE REDUCTION: This exam was performed  according to the departmental dose-optimization program which includes automated exposure control, adjustment of the mA and/or kV according to patient size and/or use of iterative reconstruction technique. CONTRAST:  81mL OMNIPAQUE IOHEXOL 300 MG/ML  SOLN COMPARISON:  CT February 08, 2022. FINDINGS: Lower chest: No acute abnormality. Hepatobiliary: Diffuse hepatic steatosis. Gallbladder surgically absent. No biliary ductal dilation. Pancreas: No pancreatic ductal dilation or evidence of acute inflammation. Spleen: No splenomegaly or focal splenic lesion. Adrenals/Urinary Tract: Bilateral adrenal glands appear normal. No hydronephrosis. Kidneys demonstrate symmetric enhancement. Stomach/Bowel: Stomach is distended with ingested material and gas without focal wall thickening. Pathologic dilation of small or large bowel. Normal appendix. Terminal ileum appears normal. No evidence of acute bowel inflammation. Vascular/Lymphatic: Normal caliber abdominal aorta. Smooth IVC contours. No pathologically enlarged abdominal or pelvic lymph nodes Reproductive: 3.6 cm right ovarian cyst, considered benign requiring no independent imaging follow-up. Adjacent fluid-filled tubular serpiginous structure likely reflecting hydrosalpinx. Uterus is unremarkable. Left adnexa appears normal. Other: No significant abdominopelvic free fluid. Musculoskeletal: Right acetabular bone island. No acute osseous abnormality. IMPRESSION: 1. 3.6 cm right ovarian cyst with adjacent fluid-filled tubular serpiginous structure likely reflecting hydrosalpinx. Consider further evaluation pelvic ultrasound. 2. Diffuse hepatic steatosis. Electronically Signed   By: Maudry Mayhew M.D.   On: 12/17/2022 12:38    Procedures Procedures    Medications Ordered in ED Medications  albuterol (VENTOLIN HFA) 108 (90 Base) MCG/ACT inhaler 1-2 puff (has no administration in time range)  ipratropium-albuterol (DUONEB) 0.5-2.5 (3) MG/3ML nebulizer solution 3 mL (3  mLs Nebulization Given 12/19/22 1132)  oxyCODONE-acetaminophen (PERCOCET/ROXICET) 5-325 MG per tablet 1 tablet (1 tablet Oral Given 12/19/22 1135)  diphenhydrAMINE (BENADRYL) capsule 25 mg (25 mg Oral Given 12/19/22 1135)    ED Course/ Medical Decision Making/ A&P Clinical Course as of 12/19/22 1227  Wed Dec 19, 2022  1222 Patient noted significant improvement with administration of breathing treatment.  Patient requesting inhaler at expectant discharge. [CR]    Clinical Course User Index [CR] Peter Garter, PA                             Medical Decision Making Amount and/or Complexity of Data Reviewed Labs: ordered. Radiology: ordered.  Risk Prescription drug management.   This patient presents to the ED for concern of shortness of breath, this involves an extensive number of treatment options, and is a complaint that carries with it a high risk of complications and morbidity.  The differential diagnosis includes The causes for shortness of breath include but are  not limited to Cardiac (AHF, pericardial effusion and tamponade, arrhythmias, ischemia, etc) Respiratory (COPD, asthma, pneumonia, pneumothorax, primary pulmonary hypertension, PE/VQ mismatch) Hematological (anemia)  Co morbidities that complicate the patient evaluation  See HPI   Additional history obtained:  Additional history obtained from EMR External records from outside source obtained and reviewed including hospital records   Lab Tests:  I Ordered, and personally interpreted labs.  The pertinent results include: No leukocytosis noted.  No evidence of anemia.  Platelets within normal range.  No electrolyte abnormalities appreciated.  Renal function within normal limits.  Initial troponin of less than 2.  Respiratory viral panel negative for COVID, and flu Enza, RSV   Imaging Studies ordered:  I ordered imaging studies including chest x-ray I independently visualized and interpreted imaging which  showed no acute cardiopulmonary abnormality I agree with the radiologist interpretation   Cardiac Monitoring: / EKG:  The patient was maintained on a cardiac monitor.  I personally viewed and interpreted the cardiac monitored which showed an underlying rhythm of: Sinus rhythm without acute ischemic change from prior EKG performed   Consultations Obtained:  N/a   Problem List / ED Course / Critical interventions / Medication management  Sinus rhythm I ordered medication including DuoNeb, albuterol, Percocet, Benadryl   Reevaluation of the patient after these medicines showed that the patient improved I have reviewed the patients home medicines and have made adjustments as needed   Social Determinants of Health:  Reports tobacco use.  Denies illicit drug use.   Test / Admission - Considered:  Shortness of  breath Vitals signs within normal range and stable throughout visit. Laboratory/imaging studies significant for: See above Patient with overall reassuring workup.  Doubt ACS given reassuring EKG, negative troponin as well as lack of concerning findings on HPI.  Heart score 0-3 so may score of 0.9 to 1.7%.  Patient will score for PE 0 and PERC negative so doubt PE.  No anemia appreciated.  Doubt doubt pneumonia, pneumothorax.  Given patient's improvement with breathing treatment administered while emergency department, continued therapy recommended outpatient with albuterol inhaler.  Further workup deemed unnecessary at this time given nonemergent nature of patient's current presentation.  Patient recommended follow-up with primary care for reassessment of symptoms in the outpatient setting.  Treatment plan discussed at length with patient and she acknowledged understanding was agreeable to said plan. Worrisome signs and symptoms were discussed with the patient, and the patient acknowledged understanding to return to the ED if noticed. Patient was stable upon discharge.           Final Clinical Impression(s) / ED Diagnoses Final diagnoses:  Shortness of breath    Rx / DC Orders ED Discharge Orders     None         Wilnette Kales, [place] 12/19/22 1227    Tegeler, Gwenyth Allegra, MD 12/19/22 1440

## 2022-12-19 NOTE — ED Triage Notes (Signed)
Patient here POV from Home.  Endorses Monday being in ED for N/V/D and ABD pain that resulted in finding an Ovarian Cyst. Prescribed 2 Medications for Pain which have been ineffective. At 0100 the Patient began to have Sudden Onset of SOB and CP today.   NAD Noted during Triage, A&Ox4. GCS 15. Ambulatory.

## 2022-12-19 NOTE — Discharge Instructions (Addendum)
Note the workup today was overall reassuring.  EKG looked very similar to other EKGs he had performed in the past.  Chest x-ray was without abnormality.  Given you notice improvement with breathing treatment while in the emergency department, will send you home with albuterol inhaler.  As discussed, you may take Benadryl, Claritin, Zyrtec prior to taking pain medicine already prescribed at home to help decrease side effect profile.  Follow MyChart for the results of your respiratory viral swab.  Recommend follow-up with primary care for reassessment of your symptoms.  Please do not hesitate to return to the emergency department if the worrisome signs and symptoms we discussed become apparent.

## 2022-12-20 DIAGNOSIS — N83201 Unspecified ovarian cyst, right side: Secondary | ICD-10-CM | POA: Diagnosis not present

## 2022-12-30 IMAGING — US US ABDOMEN LIMITED
1 series · 14 of 25 positions shown · non-contrast
Comparison: None.

CLINICAL DATA: Right upper quadrant pain

EXAM:
ULTRASOUND ABDOMEN LIMITED RIGHT UPPER QUADRANT

[Series 1: us abdomen limited ruq (liver/gb) · 14 of 40 slices shown]
[im 1/40]
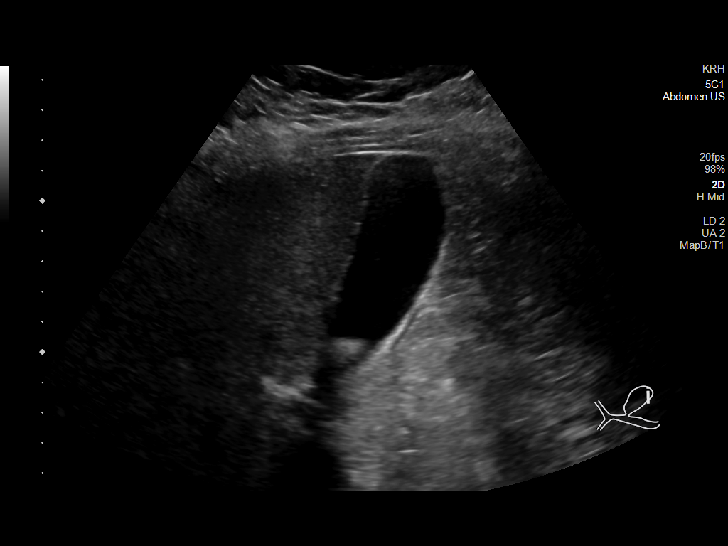
[im 4/40]
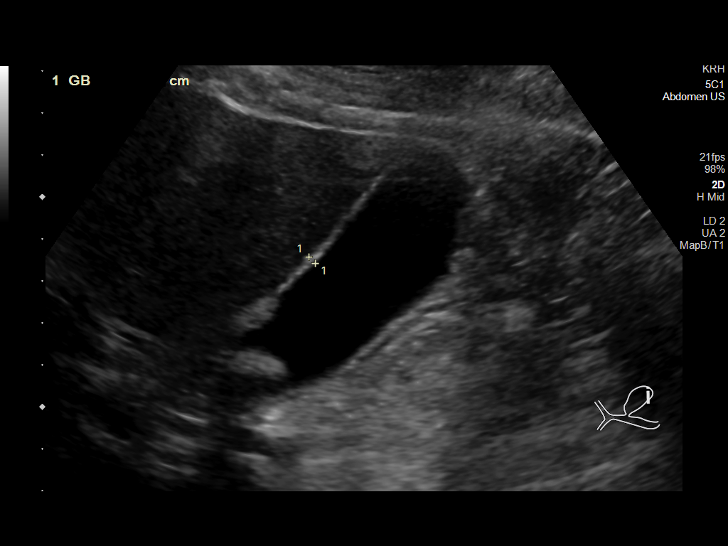
[im 7/40]
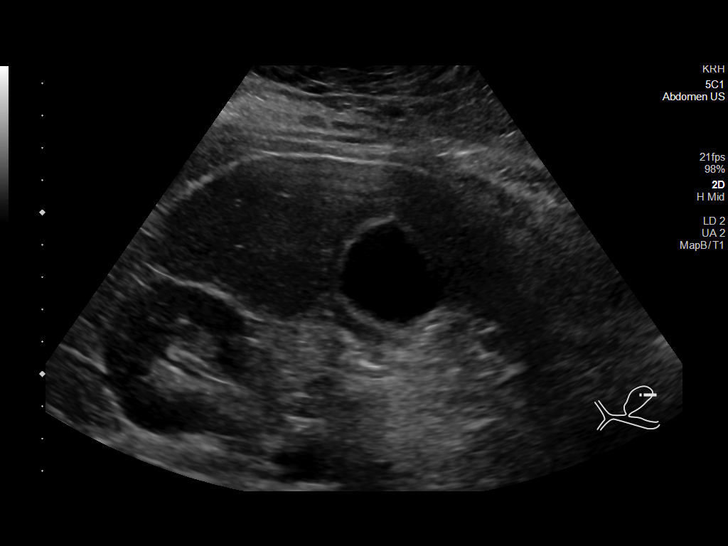
[im 10/40]
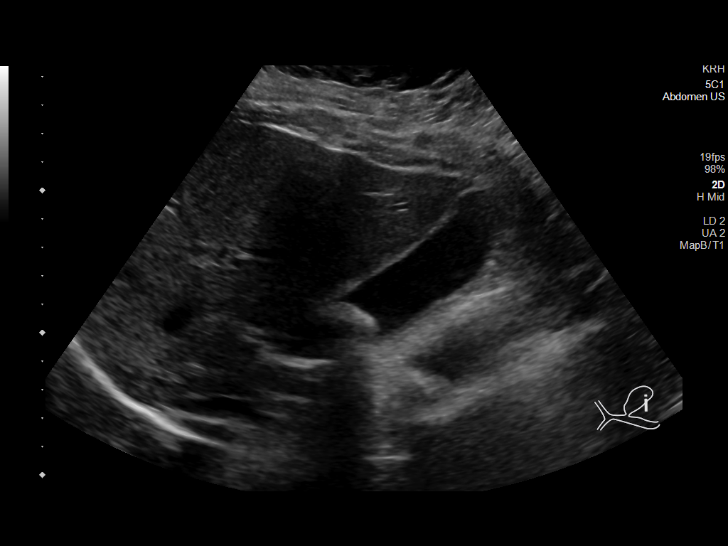
[im 14/40]
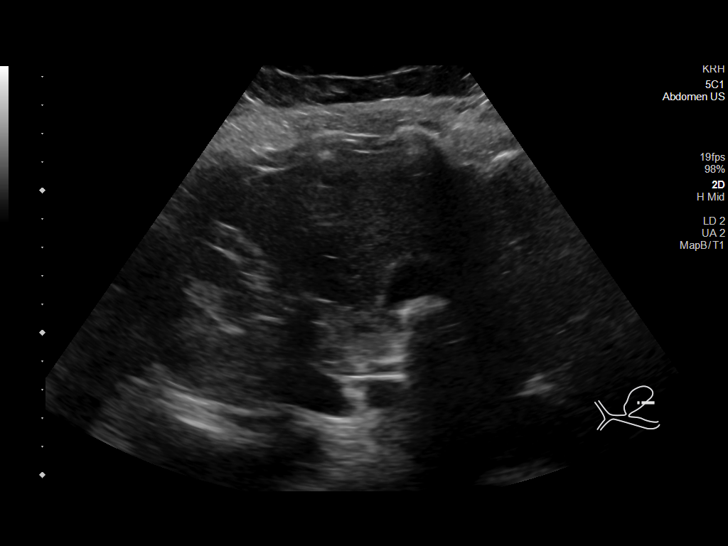
[im 15/40]
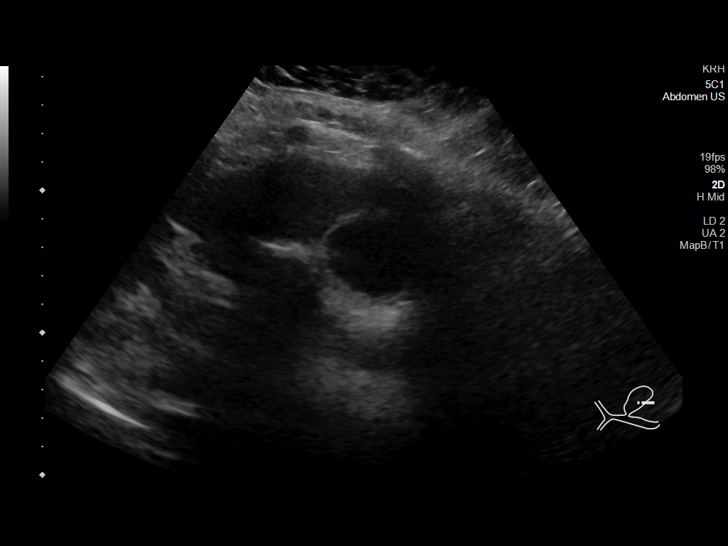
[im 18/40]
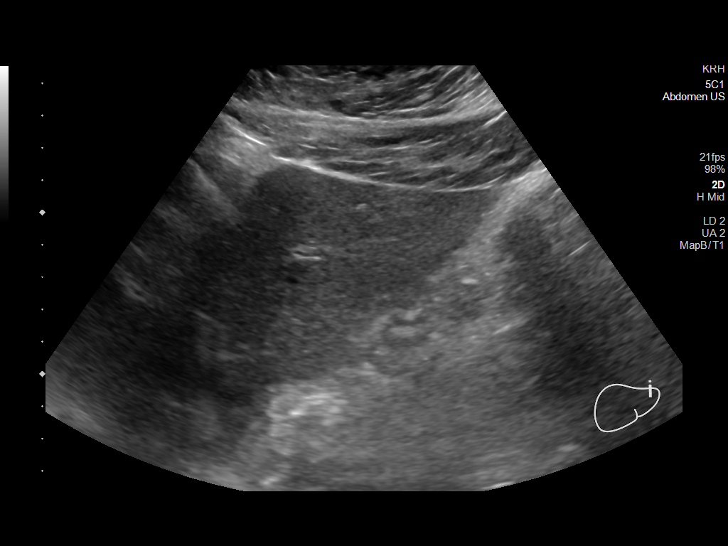
[im 22/40]
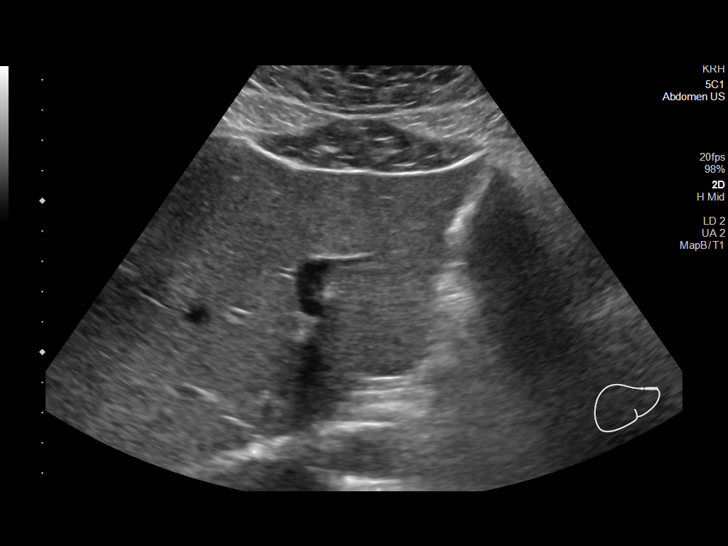
[im 25/40]
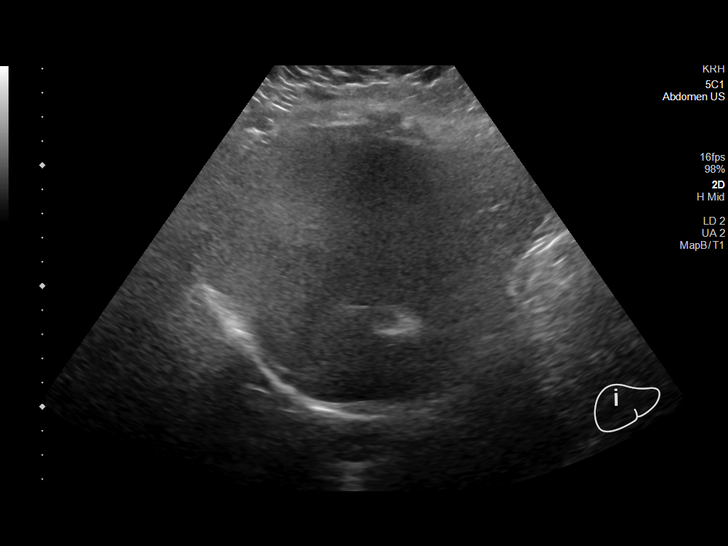
[im 27/40]
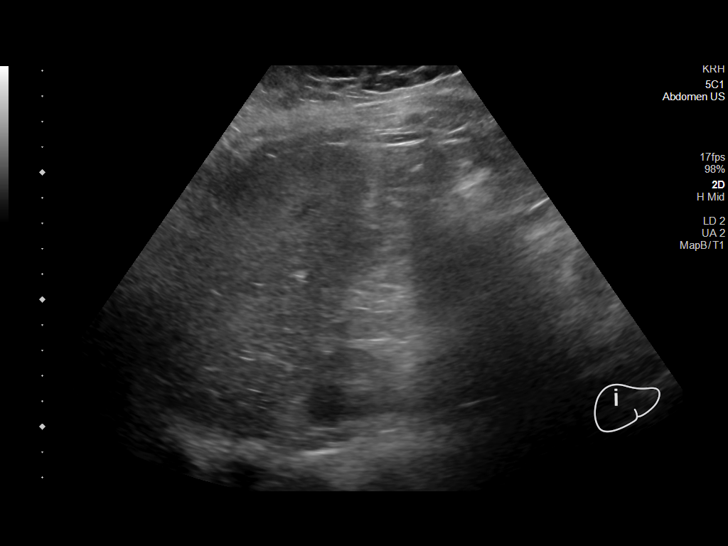
[im 30/40]
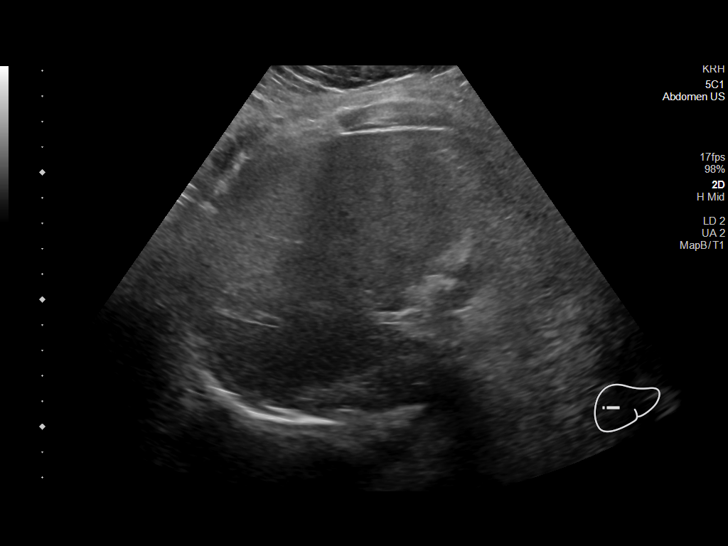
[im 33/40]
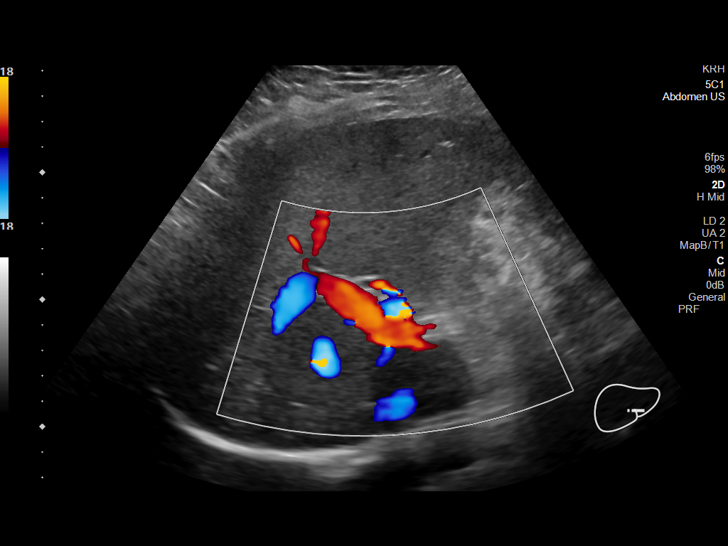
[im 36/40]
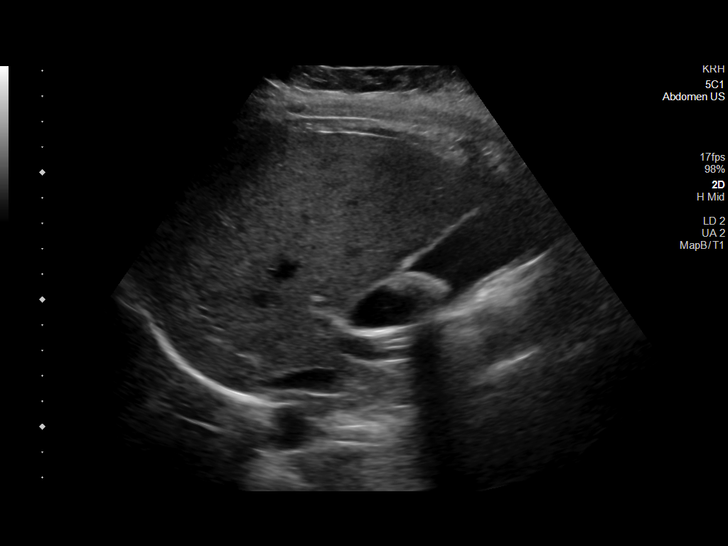
[im 40/40]
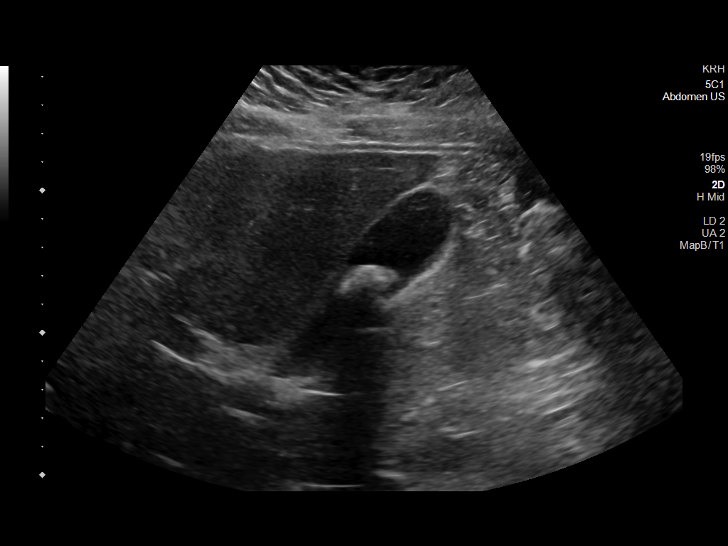

[14 of 25 positions shown; findings below may reference images not displayed]

FINDINGS: Gallbladder:

Single mobile gallstone measuring 2.2 cm. No gallbladder wall
thickening or pericholecystic fluid. Positive sonographic Murphy
sign.

Common bile duct:

Diameter: 3 mm

Liver:

No focal lesion identified. Within normal limits in parenchymal
echogenicity. Portal vein is patent on color Doppler imaging with
normal direction of blood flow towards the liver.

Other: None.
IMPRESSION: 1. Cholelithiasis without sonographic evidence of acute
cholecystitis.

## 2023-01-11 IMAGING — CT CT ABD-PELV W/ CM
2 of 5 series · 16 of 46 positions shown, 18 images · IV contrast (APPLIED)
Comparison: Ultrasound abdomen 01/27/2022.

CLINICAL DATA: Right upper quadrant pain with nausea. Gallbladder
removed 01/27/2022.

EXAM:
CT ABDOMEN AND PELVIS WITH CONTRAST
TECHNIQUE: Multidetector CT imaging of the abdomen and pelvis was performed
using the standard protocol following bolus administration of
intravenous contrast.

[Series 2: abd pel w · axial · 0.94mm/px · z∈[+892,+1322]mm · 13 of 98 slices shown, 15 images]
[im 6/98  soft-tissue]
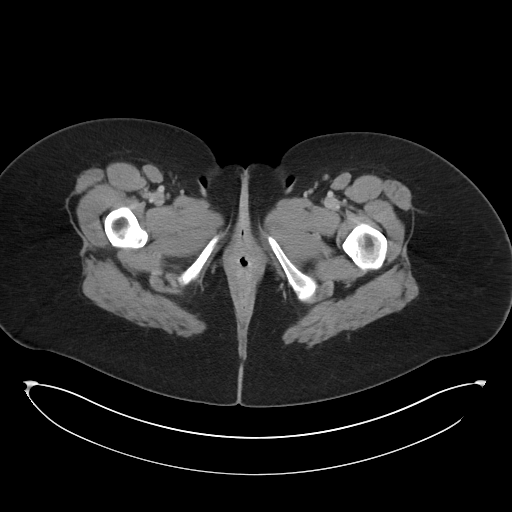
[im 6/98  bone]
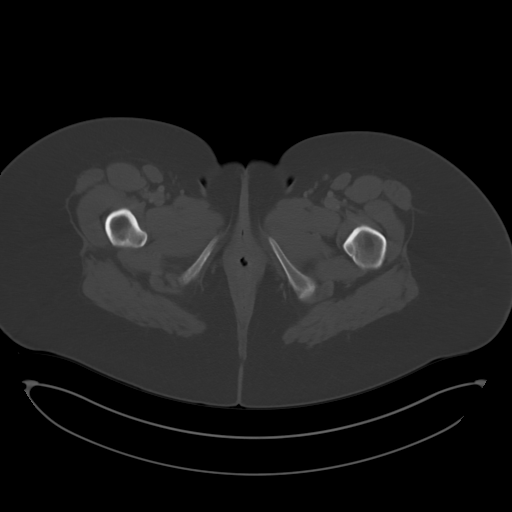
[im 12/98  soft-tissue]
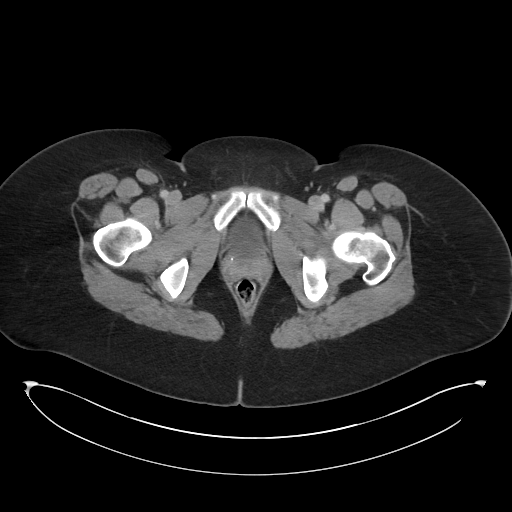
[im 23/98  soft-tissue]
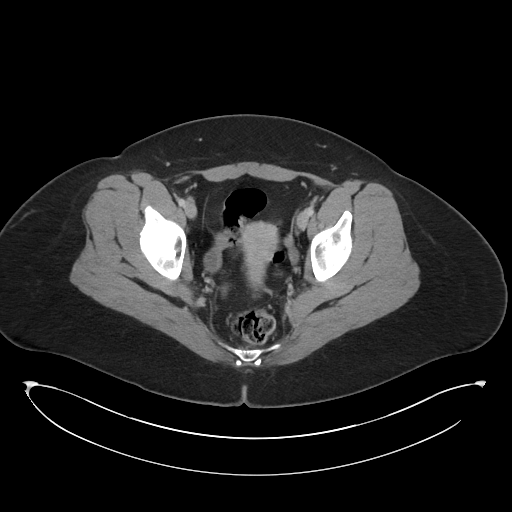
[im 29/98  soft-tissue]
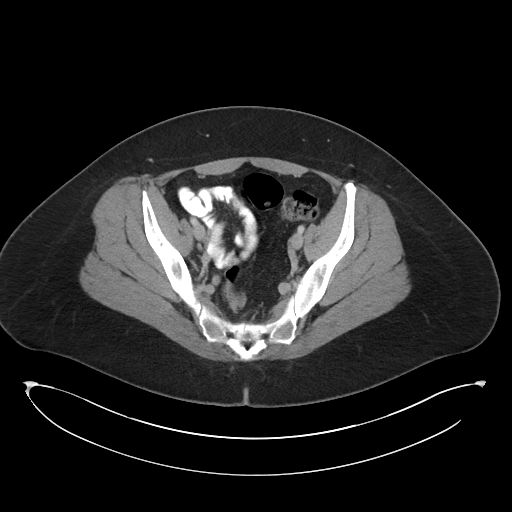
[im 35/98  soft-tissue]
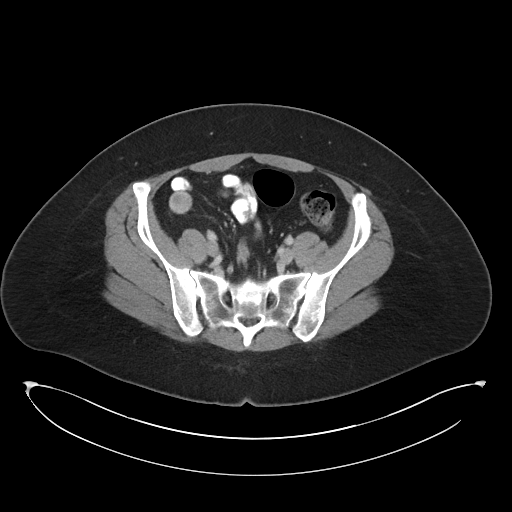
[im 40/98  soft-tissue]
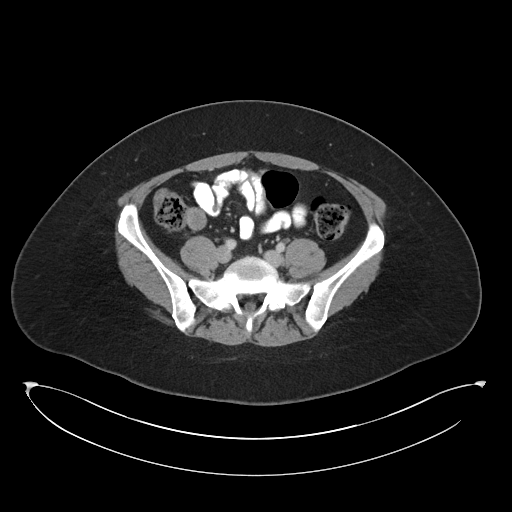
[im 52/98  soft-tissue]
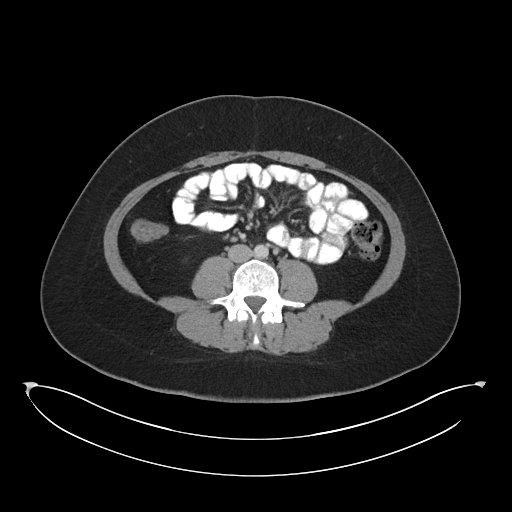
[im 58/98  soft-tissue]
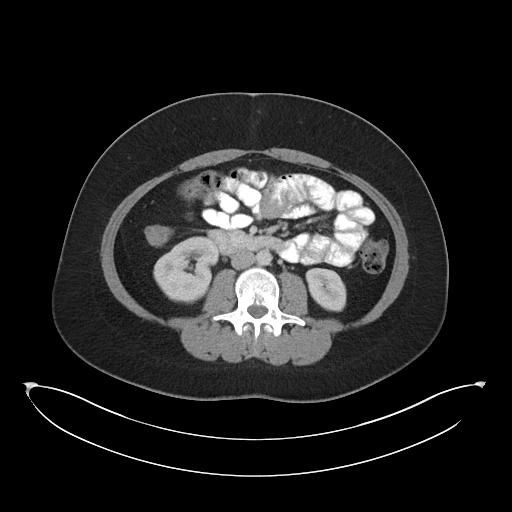
[im 63/98  soft-tissue]
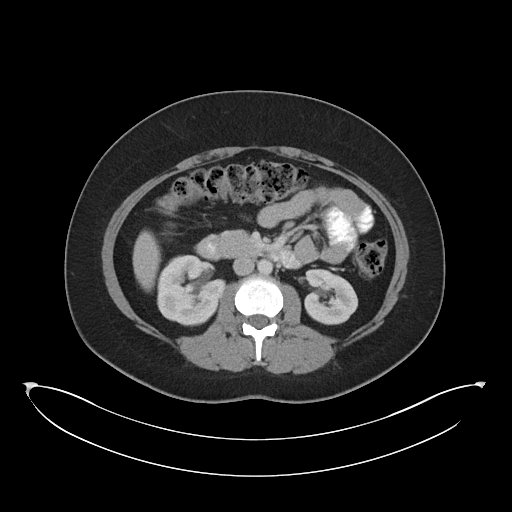
[im 63/98  bone]
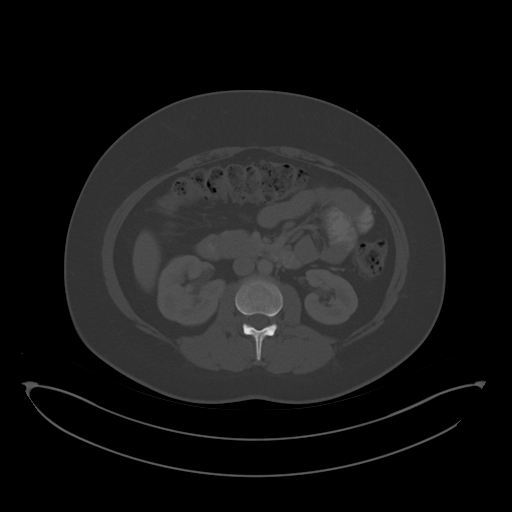
[im 69/98  soft-tissue]
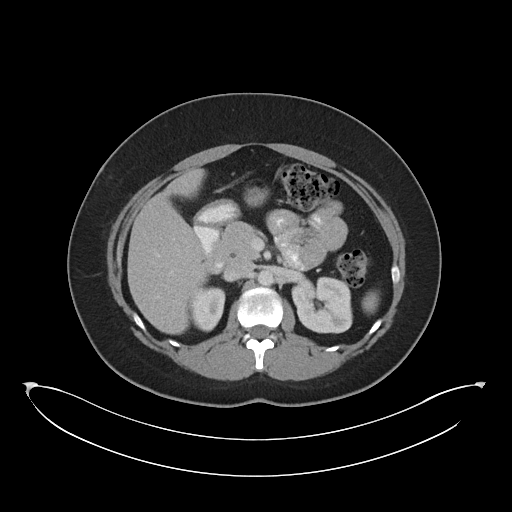
[im 75/98  soft-tissue]
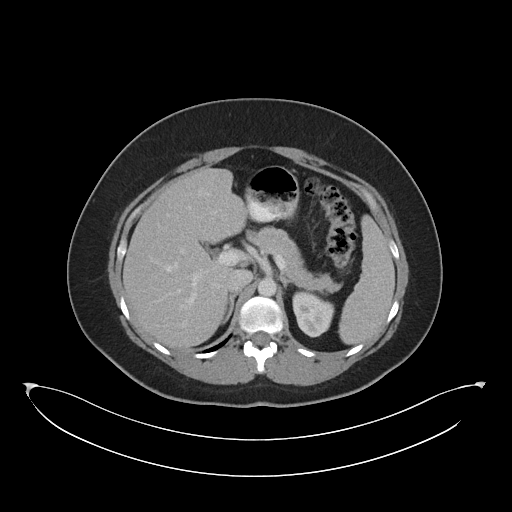
[im 86/98  soft-tissue]
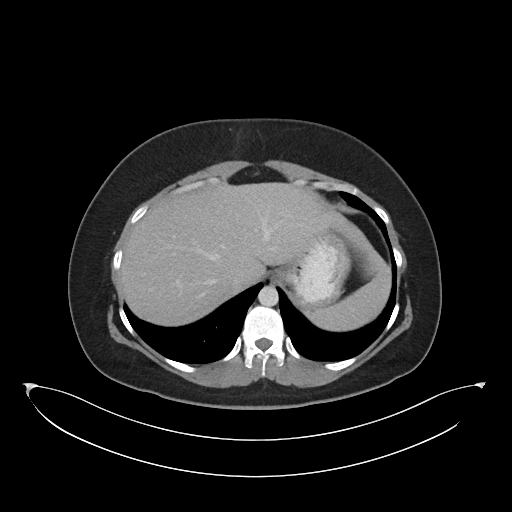
[im 92/98  soft-tissue]
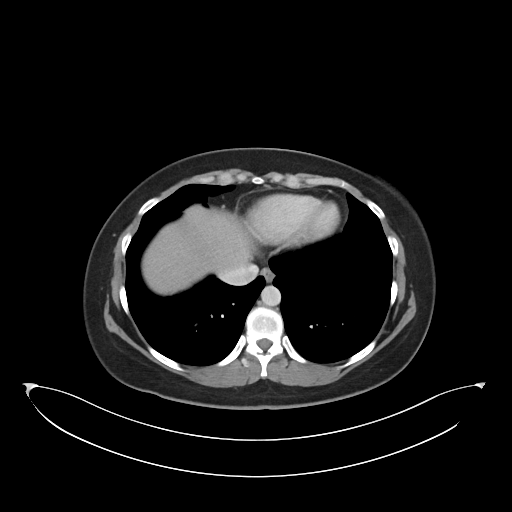

[Series 5: coronal · coronal · 0.98mm/px · 3 of 110 slices shown]
[im 37/110  soft-tissue]
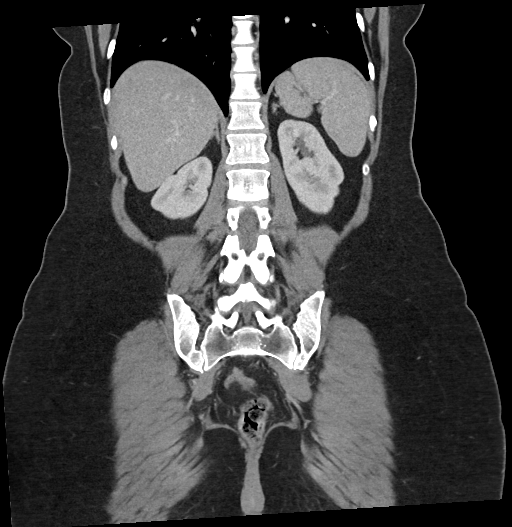
[im 49/110  soft-tissue]
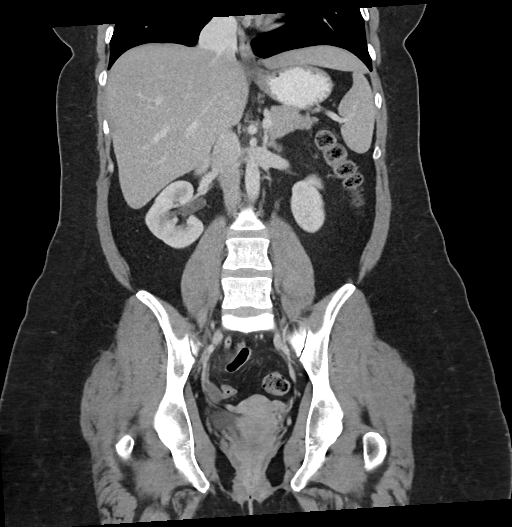
[im 61/110  soft-tissue]
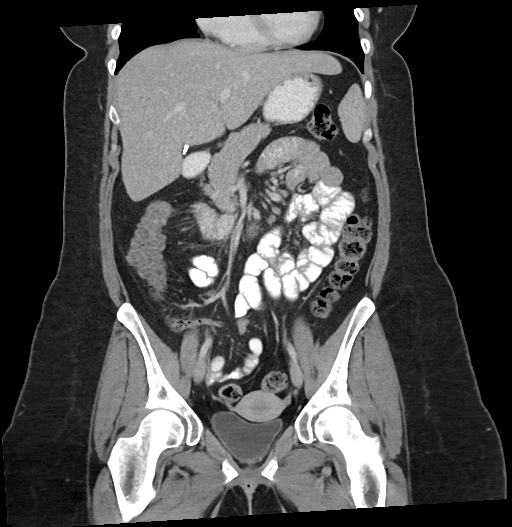

[16 of 46 positions shown; findings below may reference images not displayed]

RADIATION DOSE REDUCTION: This exam was performed according to the
departmental dose-optimization program which includes automated
exposure control, adjustment of the mA and/or kV according to
patient size and/or use of iterative reconstruction technique.

CONTRAST:  85mL OMNIPAQUE IOHEXOL 300 MG/ML  SOLN
FINDINGS: Lower chest: No acute abnormality.

Hepatobiliary: The gallbladder surgically absent. There is no
biliary ductal dilatation. There is no fluid collection in the
gallbladder fossa. The liver is within normal limits.

Pancreas: Unremarkable. No pancreatic ductal dilatation or
surrounding inflammatory changes.

Spleen: Normal in size without focal abnormality.

Adrenals/Urinary Tract: Adrenal glands are unremarkable. Kidneys are
normal, without renal calculi, focal lesion, or hydronephrosis.
Bladder is unremarkable.

Stomach/Bowel: Stomach is within normal limits. Appendix appears
normal. No evidence of bowel wall thickening, distention, or
inflammatory changes.

Vascular/Lymphatic: No significant vascular findings are present. No
enlarged abdominal or pelvic lymph nodes.

Reproductive: Uterus and bilateral adnexa are unremarkable.

Other: There is a small fat containing umbilical hernia. There is
minimal subcutaneous stranding and skin thickening in the upper
anterior abdominal wall just right of midline likely related to
recent surgery. There is no evidence for hematoma or fluid
collection in the abdominal wall. There is no free air or ascites.

Musculoskeletal: No acute or significant osseous findings.
IMPRESSION: 1. Status post cholecystectomy. No fluid collection in the
gallbladder fossa.
2. Minimal subcutaneous stranding and skin thickening in the upper
anterior abdominal wall likely related to recent surgery. No fluid
collection or hematoma.

## 2023-01-24 DIAGNOSIS — N83201 Unspecified ovarian cyst, right side: Secondary | ICD-10-CM | POA: Diagnosis not present

## 2023-01-24 DIAGNOSIS — R1031 Right lower quadrant pain: Secondary | ICD-10-CM | POA: Diagnosis not present

## 2023-01-24 DIAGNOSIS — N910 Primary amenorrhea: Secondary | ICD-10-CM | POA: Diagnosis not present

## 2023-03-06 DIAGNOSIS — F4321 Adjustment disorder with depressed mood: Secondary | ICD-10-CM | POA: Diagnosis not present

## 2023-03-22 DIAGNOSIS — F4321 Adjustment disorder with depressed mood: Secondary | ICD-10-CM | POA: Diagnosis not present

## 2023-03-29 DIAGNOSIS — F4321 Adjustment disorder with depressed mood: Secondary | ICD-10-CM | POA: Diagnosis not present

## 2023-04-12 DIAGNOSIS — F4321 Adjustment disorder with depressed mood: Secondary | ICD-10-CM | POA: Diagnosis not present

## 2023-04-26 DIAGNOSIS — F4321 Adjustment disorder with depressed mood: Secondary | ICD-10-CM | POA: Diagnosis not present

## 2023-05-10 DIAGNOSIS — F4321 Adjustment disorder with depressed mood: Secondary | ICD-10-CM | POA: Diagnosis not present

## 2023-05-24 DIAGNOSIS — F4321 Adjustment disorder with depressed mood: Secondary | ICD-10-CM | POA: Diagnosis not present

## 2023-06-03 DIAGNOSIS — Z13220 Encounter for screening for lipoid disorders: Secondary | ICD-10-CM | POA: Diagnosis not present

## 2023-06-03 DIAGNOSIS — Z01419 Encounter for gynecological examination (general) (routine) without abnormal findings: Secondary | ICD-10-CM | POA: Diagnosis not present

## 2023-06-03 DIAGNOSIS — Z6838 Body mass index (BMI) 38.0-38.9, adult: Secondary | ICD-10-CM | POA: Diagnosis not present

## 2023-06-03 DIAGNOSIS — Z13228 Encounter for screening for other metabolic disorders: Secondary | ICD-10-CM | POA: Diagnosis not present

## 2023-06-03 DIAGNOSIS — Z1329 Encounter for screening for other suspected endocrine disorder: Secondary | ICD-10-CM | POA: Diagnosis not present

## 2023-06-03 DIAGNOSIS — Z131 Encounter for screening for diabetes mellitus: Secondary | ICD-10-CM | POA: Diagnosis not present

## 2023-06-14 DIAGNOSIS — F4321 Adjustment disorder with depressed mood: Secondary | ICD-10-CM | POA: Diagnosis not present

## 2023-06-18 DIAGNOSIS — F4321 Adjustment disorder with depressed mood: Secondary | ICD-10-CM | POA: Diagnosis not present

## 2023-06-20 DIAGNOSIS — F419 Anxiety disorder, unspecified: Secondary | ICD-10-CM | POA: Diagnosis not present

## 2023-06-20 DIAGNOSIS — F322 Major depressive disorder, single episode, severe without psychotic features: Secondary | ICD-10-CM | POA: Diagnosis not present

## 2023-06-20 DIAGNOSIS — G4700 Insomnia, unspecified: Secondary | ICD-10-CM | POA: Diagnosis not present

## 2023-06-30 DIAGNOSIS — N390 Urinary tract infection, site not specified: Secondary | ICD-10-CM | POA: Diagnosis not present

## 2023-07-05 DIAGNOSIS — F4321 Adjustment disorder with depressed mood: Secondary | ICD-10-CM | POA: Diagnosis not present

## 2023-07-05 DIAGNOSIS — F4323 Adjustment disorder with mixed anxiety and depressed mood: Secondary | ICD-10-CM | POA: Diagnosis not present

## 2023-07-05 DIAGNOSIS — G4700 Insomnia, unspecified: Secondary | ICD-10-CM | POA: Diagnosis not present

## 2023-07-08 ENCOUNTER — Emergency Department (HOSPITAL_BASED_OUTPATIENT_CLINIC_OR_DEPARTMENT_OTHER)
Admission: EM | Admit: 2023-07-08 | Discharge: 2023-07-09 | Discharge status: Against Medical Advice | Payer: BC Managed Care – PPO | Attending: Emergency Medicine | Admitting: Emergency Medicine

## 2023-07-08 ENCOUNTER — Encounter (HOSPITAL_BASED_OUTPATIENT_CLINIC_OR_DEPARTMENT_OTHER): Payer: Self-pay | Admitting: Emergency Medicine

## 2023-07-08 ENCOUNTER — Other Ambulatory Visit: Payer: Self-pay

## 2023-07-08 DIAGNOSIS — M546 Pain in thoracic spine: Secondary | ICD-10-CM | POA: Diagnosis not present

## 2023-07-08 DIAGNOSIS — M419 Scoliosis, unspecified: Secondary | ICD-10-CM | POA: Diagnosis not present

## 2023-07-08 DIAGNOSIS — M545 Low back pain, unspecified: Secondary | ICD-10-CM | POA: Diagnosis not present

## 2023-07-08 DIAGNOSIS — R350 Frequency of micturition: Secondary | ICD-10-CM | POA: Diagnosis not present

## 2023-07-08 DIAGNOSIS — R110 Nausea: Secondary | ICD-10-CM | POA: Diagnosis not present

## 2023-07-08 DIAGNOSIS — R1030 Lower abdominal pain, unspecified: Secondary | ICD-10-CM | POA: Diagnosis not present

## 2023-07-08 DIAGNOSIS — M549 Dorsalgia, unspecified: Secondary | ICD-10-CM | POA: Diagnosis not present

## 2023-07-08 DIAGNOSIS — Z5321 Procedure and treatment not carried out due to patient leaving prior to being seen by health care provider: Secondary | ICD-10-CM | POA: Diagnosis not present

## 2023-07-08 LAB — CBC WITH DIFFERENTIAL/PLATELET
Abs Immature Granulocytes: 0.05 10*3/uL (ref 0.00–0.07)
Basophils Absolute: 0.1 10*3/uL (ref 0.0–0.1)
Basophils Relative: 1 %
Eosinophils Absolute: 0.4 10*3/uL (ref 0.0–0.5)
Eosinophils Relative: 4 %
HCT: 39.2 % (ref 36.0–46.0)
Hemoglobin: 12.7 g/dL (ref 12.0–15.0)
Immature Granulocytes: 0 %
Lymphocytes Relative: 29 %
Lymphs Abs: 3.2 10*3/uL (ref 0.7–4.0)
MCH: 28.3 pg (ref 26.0–34.0)
MCHC: 32.4 g/dL (ref 30.0–36.0)
MCV: 87.5 fL (ref 80.0–100.0)
Monocytes Absolute: 0.7 10*3/uL (ref 0.1–1.0)
Monocytes Relative: 6 %
Neutro Abs: 6.8 10*3/uL (ref 1.7–7.7)
Neutrophils Relative %: 60 %
Platelets: 351 10*3/uL (ref 150–400)
RBC: 4.48 MIL/uL (ref 3.87–5.11)
RDW: 13.5 % (ref 11.5–15.5)
WBC: 11.3 10*3/uL — ABNORMAL HIGH (ref 4.0–10.5)
nRBC: 0 % (ref 0.0–0.2)

## 2023-07-08 LAB — URINALYSIS, ROUTINE W REFLEX MICROSCOPIC
Bilirubin Urine: NEGATIVE
Glucose, UA: NEGATIVE mg/dL
Hgb urine dipstick: NEGATIVE
Leukocytes,Ua: NEGATIVE
Nitrite: NEGATIVE
Protein, ur: 30 mg/dL — AB
Specific Gravity, Urine: 1.036 — ABNORMAL HIGH (ref 1.005–1.030)
pH: 6 (ref 5.0–8.0)

## 2023-07-08 LAB — COMPREHENSIVE METABOLIC PANEL
ALT: 18 U/L (ref 0–44)
AST: 17 U/L (ref 15–41)
Albumin: 4.1 g/dL (ref 3.5–5.0)
Alkaline Phosphatase: 85 U/L (ref 38–126)
Anion gap: 10 (ref 5–15)
BUN: 12 mg/dL (ref 6–20)
CO2: 23 mmol/L (ref 22–32)
Calcium: 8.8 mg/dL — ABNORMAL LOW (ref 8.9–10.3)
Chloride: 102 mmol/L (ref 98–111)
Creatinine, Ser: 0.76 mg/dL (ref 0.44–1.00)
GFR, Estimated: >60 mL/min (ref >60)
Glucose, Bld: 103 mg/dL — ABNORMAL HIGH (ref 70–99)
Potassium: 3.6 mmol/L (ref 3.5–5.1)
Sodium: 135 mmol/L (ref 135–145)
Total Bilirubin: 0.5 mg/dL (ref 0.3–1.2)
Total Protein: 7.1 g/dL (ref 6.5–8.1)

## 2023-07-08 LAB — LIPASE, BLOOD: Lipase: 13 U/L (ref 11–51)

## 2023-07-08 LAB — PREGNANCY, URINE: Preg Test, Ur: NEGATIVE

## 2023-07-08 NOTE — ED Triage Notes (Signed)
Pt c/o back pain and nausea x 2 days. Was recently diagnosed with a UTI and placed on cipro and phenazopyridine.

## 2023-07-09 ENCOUNTER — Emergency Department (HOSPITAL_BASED_OUTPATIENT_CLINIC_OR_DEPARTMENT_OTHER)
Admission: EM | Admit: 2023-07-09 | Discharge: 2023-07-09 | Disposition: A | Discharge status: Home / Self Care | Payer: BC Managed Care – PPO | Source: Home / Self Care

## 2023-07-09 ENCOUNTER — Emergency Department (HOSPITAL_BASED_OUTPATIENT_CLINIC_OR_DEPARTMENT_OTHER): Payer: BC Managed Care – PPO | Admitting: Radiology

## 2023-07-09 ENCOUNTER — Encounter (HOSPITAL_BASED_OUTPATIENT_CLINIC_OR_DEPARTMENT_OTHER): Payer: Self-pay | Admitting: Emergency Medicine

## 2023-07-09 ENCOUNTER — Emergency Department (HOSPITAL_BASED_OUTPATIENT_CLINIC_OR_DEPARTMENT_OTHER): Payer: BC Managed Care – PPO

## 2023-07-09 DIAGNOSIS — K429 Umbilical hernia without obstruction or gangrene: Secondary | ICD-10-CM | POA: Diagnosis not present

## 2023-07-09 DIAGNOSIS — R1030 Lower abdominal pain, unspecified: Secondary | ICD-10-CM | POA: Insufficient documentation

## 2023-07-09 DIAGNOSIS — M549 Dorsalgia, unspecified: Secondary | ICD-10-CM

## 2023-07-09 DIAGNOSIS — R350 Frequency of micturition: Secondary | ICD-10-CM | POA: Insufficient documentation

## 2023-07-09 DIAGNOSIS — M48061 Spinal stenosis, lumbar region without neurogenic claudication: Secondary | ICD-10-CM | POA: Diagnosis not present

## 2023-07-09 DIAGNOSIS — K760 Fatty (change of) liver, not elsewhere classified: Secondary | ICD-10-CM | POA: Diagnosis not present

## 2023-07-09 DIAGNOSIS — Z9049 Acquired absence of other specified parts of digestive tract: Secondary | ICD-10-CM | POA: Diagnosis not present

## 2023-07-09 DIAGNOSIS — R109 Unspecified abdominal pain: Secondary | ICD-10-CM | POA: Diagnosis not present

## 2023-07-09 DIAGNOSIS — M545 Low back pain, unspecified: Secondary | ICD-10-CM | POA: Insufficient documentation

## 2023-07-09 DIAGNOSIS — M5136 Other intervertebral disc degeneration, lumbar region: Secondary | ICD-10-CM

## 2023-07-09 LAB — PREGNANCY, URINE: Preg Test, Ur: NEGATIVE

## 2023-07-09 MED ORDER — LIDOCAINE 5 % EX PTCH
1.0000 | MEDICATED_PATCH | CUTANEOUS | Status: DC
  Administered 2023-07-09: 1 via TRANSDERMAL
  Filled 2023-07-09: qty 1

## 2023-07-09 MED ORDER — CYCLOBENZAPRINE HCL 10 MG PO TABS: 10.0000 mg | ORAL_TABLET | Freq: Two times a day (BID) | ORAL | 0 refills | Status: DC | PRN

## 2023-07-09 MED ORDER — CYCLOBENZAPRINE HCL 10 MG PO TABS
10.0000 mg | ORAL_TABLET | Freq: Once | ORAL | Status: DC
  Filled 2023-07-09: qty 1

## 2023-07-09 MED ORDER — KETOROLAC TROMETHAMINE 30 MG/ML IJ SOLN
30.0000 mg | Freq: Once | INTRAMUSCULAR | Status: DC
  Filled 2023-07-09: qty 1

## 2023-07-09 MED ORDER — IBUPROFEN 600 MG PO TABS: 600.0000 mg | ORAL_TABLET | Freq: Four times a day (QID) | ORAL | 0 refills | Status: DC | PRN

## 2023-07-09 MED ORDER — KETOROLAC TROMETHAMINE 30 MG/ML IJ SOLN
30.0000 mg | Freq: Once | INTRAMUSCULAR | Status: AC
  Administered 2023-07-09: 30 mg via INTRAMUSCULAR
  Filled 2023-07-09: qty 1

## 2023-07-09 NOTE — ED Notes (Signed)
Pt does not have anyone to drive her home, flexeril held.

## 2023-07-09 NOTE — ED Provider Notes (Signed)
[place] EMERGENCY DEPARTMENT AT [facility] Provider Note   CSN: [id number] Arrival date & time: 07/09/23  1250     History  Chief Complaint  Patient presents with   Back Pain    ATHZIRY WYNNE is a 32 y.o. female.   Back Pain   32 year old female presents emergency department with complaints of low back pain.  Patient states she has had low back pain for the past 3 days.  Reports history of urinary tract infection a week and 1/2 to 2 weeks ago they was treated with subsequent relief of her suprapubic discomfort as well as her urinary frequency.  States that symptoms of low back pain developed when she was at home.  Denies any known injury to low back.  Denies any weakness sensation deficits in lower extremities, fever, history of IV drug use, known malignancy, prolonged corticosteroid use, saddle anesthesia, bowel/bladder dysfunction.  Denies any urinary symptoms, abdominal pain, fever, chills, change in bowel habits.  Has been trying at home Tylenol arthritis which has been helping her symptoms some.  Received emergency department yesterday laboratory studies drawn but left without being seen due to long wait time.  Past medical history significant for leukopenia, prediabetes, allergic rhinitis  Home Medications Prior to Admission medications   Medication Sig Start Date End Date Taking? Authorizing Provider  cyclobenzaprine (FLEXERIL) 10 MG tablet Take 1 tablet (10 mg total) by mouth 2 (two) times daily as needed for muscle spasms. 07/09/23  Yes Sherian Maroon A, PA  ibuprofen (ADVIL) 600 MG tablet Take 1 tablet (600 mg total) by mouth every 6 (six) hours as needed. 07/09/23  Yes Sherian Maroon A, PA  APPLE CIDER VINEGAR PO Take 2 tablets by mouth at bedtime. gummies    [provider]  buPROPion (WELLBUTRIN XL) 300 MG 24 hr tablet Take 300 mg by mouth as needed. 02/21/21   [provider]  diphenhydrAMINE (BENADRYL) 25 MG tablet Take 25 mg by mouth  at bedtime.    [provider]  escitalopram (LEXAPRO) 10 MG tablet Take 10 mg by mouth daily. 05/05/21   [provider]  etonogestrel-ethinyl estradiol (NUVARING) 0.12-0.015 MG/24HR vaginal ring Place 1 each vaginally every 28 (twenty-eight) days.    [provider]  HYDROcodone-acetaminophen (NORCO/VICODIN) 5-325 MG tablet Take 2 tablets by mouth every 4 (four) hours as needed. 12/17/22   Gareth Eagle, PA-C  hydrOXYzine (ATARAX) 25 MG tablet Take 25 mg by mouth daily. 10/17/22   [provider]  loratadine (CLARITIN) 10 MG tablet Take 10 mg by mouth as needed for allergies.    [provider]  MILI 0.25-35 MG-MCG tablet Take 1 tablet by mouth daily. 12/12/22   [provider]  ondansetron (ZOFRAN) 4 MG tablet Take 1 tablet (4 mg total) by mouth every 6 (six) hours. 12/17/22   Gareth Eagle, PA-C      Allergies    Famotidine    Review of Systems   Review of Systems  Musculoskeletal:  Positive for back pain.  All other systems reviewed and are negative.   Physical Exam Updated Vital Signs BP (!) 113/57   Pulse 71   Temp 98.4 F (36.9 C) (Oral)   Resp 18   Ht 5\' 6"  (1.676 m)   Wt 104.8 kg   LMP 06/17/2023 (Approximate)   SpO2 97%   BMI 37.28 kg/m  Physical Exam Vitals and nursing note reviewed.  Constitutional:      General: She is not in  acute distress.    Appearance: She is well-developed.  HENT:     Head: Normocephalic and atraumatic.  Eyes:     Conjunctiva/sclera: Conjunctivae normal.  Cardiovascular:     Rate and Rhythm: Normal rate and regular rhythm.     Heart sounds: No murmur heard. Pulmonary:     Effort: Pulmonary effort is normal. No respiratory distress.     Breath sounds: Normal breath sounds.  Abdominal:     Palpations: Abdomen is soft.     Tenderness: There is no abdominal tenderness. There is no right CVA tenderness or left CVA tenderness.  Musculoskeletal:        General: No swelling.      Cervical back: Neck supple.     Comments: No midline tenderness of cervical, thoracic, lumbar spine with no obvious type of deformity noted.  Paraspinal tenderness in the upper lumbar region bilaterally.  Muscular strength 5 out of 5 for the extremities.  No sensory deficits along major nerve distributions of lower extremities.  Pedal pulses 2+ bilaterally.  DTRs symmetric at patella.  Skin:    General: Skin is warm and dry.     Capillary Refill: Capillary refill takes less than 2 seconds.  Neurological:     Mental Status: She is alert.  Psychiatric:        Mood and Affect: Mood normal.     ED Results / Procedures / Treatments   Labs (all labs ordered are listed, but only abnormal results are displayed) Labs Reviewed  PREGNANCY, URINE    EKG None  Radiology CT L-SPINE NO CHARGE  Result Date: 07/09/2023 CLINICAL DATA:  Back pain, flank pain EXAM: CT LUMBAR SPINE WITHOUT CONTRAST TECHNIQUE: Multidetector CT imaging of the lumbar spine was performed without intravenous contrast administration. Multiplanar CT image reconstructions were also generated. RADIATION DOSE REDUCTION: This exam was performed according to the departmental dose-optimization program which includes automated exposure control, adjustment of the mA and/or kV according to patient size and/or use of iterative reconstruction technique. COMPARISON:  None Available. FINDINGS: Segmentation: There are 5 non-rib-bearing vertebrae in lumbar region. Alignment: Alignment of posterior margins of vertebral bodies appears normal. Vertebrae: No recent fracture is seen. Paraspinal and other soft tissues: There is no central spinal stenosis. Paraspinal soft tissues are unremarkable. Disc levels: There is no significant disc space narrowing. There is mild narrowing of neural foramina by bulging annulus and facet hypertrophy at L4-L5 and L5-S1 levels. IMPRESSION: No recent fracture is seen. Alignment of posterior margins of vertebral bodies  appears normal. There is no significant disc space narrowing. There is mild encroachment of neural foramina by bulging annulus and facet hypertrophy at L4-L5 and L5-S1 levels. Electronically Signed   By: Ernie Avena M.D.   On: 07/09/2023 16:43   CT Renal Stone Study  Result Date: 07/09/2023 CLINICAL DATA:  Abdominal pain, flank pain EXAM: CT ABDOMEN AND PELVIS WITHOUT CONTRAST TECHNIQUE: Multidetector CT imaging of the abdomen and pelvis was performed following the standard protocol without IV contrast. RADIATION DOSE REDUCTION: This exam was performed according to the departmental dose-optimization program which includes automated exposure control, adjustment of the mA and/or kV according to patient size and/or use of iterative reconstruction technique. COMPARISON:  12/17/2022 FINDINGS: Lower chest: Visualized lower lung fields are clear. Hepatobiliary: There is fatty infiltration in liver. Surgical clips are seen in gallbladder fossa. There is no dilation of bile ducts. Pancreas: No focal abnormalities are seen. Spleen: Unremarkable. Adrenals/Urinary Tract: Adrenals are unremarkable. There is no hydronephrosis. There  are no renal or ureteral stones. Urinary bladder is not distended. Stomach/Bowel: Stomach is unremarkable. Small bowel loops are not dilated. Appendix is not dilated. There is no significant wall thickening in colon. No pericolic stranding. Vascular/Lymphatic: Unremarkable. Reproductive: There is interval resolution of right adnexal cysts. Other: There is no ascites or pneumoperitoneum. Umbilical hernia containing fat is seen. Musculoskeletal: No acute findings are seen. Sclerotic density in right acetabulum has not changed suggesting benign bone island. IMPRESSION: There is no evidence of intestinal obstruction or pneumoperitoneum. Appendix is not dilated. There is no hydronephrosis. Fatty liver.  Status post cholecystectomy. Electronically Signed   By: Ernie Avena M.D.   On:  07/09/2023 16:39    Procedures Procedures    Medications Ordered in ED Medications  lidocaine (LIDODERM) 5 % 1 patch (1 patch Transdermal Patch Applied 07/09/23 1516)  ketorolac (TORADOL) 30 MG/ML injection 30 mg (30 mg Intramuscular Given 07/09/23 1702)    ED Course/ Medical Decision Making/ A&P                                 Medical Decision Making Amount and/or Complexity of Data Reviewed Labs: ordered. Radiology: ordered.  Risk Prescription drug management.   This patient presents to the ED for concern of low back pain, this involves an extensive number of treatment options, and is a complaint that carries with it a high risk of complications and morbidity.  The differential diagnosis includes fracture, strain/pain, dislocation, nephrolithiasis, pyelonephritis, cauda equina, spinal abscess, spinal cord compression   Co morbidities that complicate the patient evaluation  See HPI   Additional history obtained:  Additional history obtained from EMR External records from outside source obtained and reviewed including hospital records   Lab Tests:  I Ordered, and personally interpreted labs.  The pertinent results include: Urine pregnancy which is negative   Imaging Studies ordered:  I ordered imaging studies including CT renal stone/CT L-spine I independently visualized and interpreted imaging which showed  CT renal stone study: No acute abnormality. CT L-spine: No acute fracture/traumatic subluxation of L-spine.  Patient with bulging annulus at L4-5, L5-S1 I agree with the radiologist interpretation   Cardiac Monitoring: / EKG:  The patient was maintained on a cardiac monitor.  I personally viewed and interpreted the cardiac monitored which showed an underlying rhythm of: Sinus rhythm   Consultations Obtained:  N/a   Problem List / ED Course / Critical interventions / Medication management  Back pain I ordered medication including Toradol, Lidoderm    Reevaluation of the patient after these medicines showed that the patient improved I have reviewed the patients home medicines and have made adjustments as needed   Social Determinants of Health:  Former cigarette use.  Denies illicit drug use.   Test / Admission - Considered:  Back pain Vitals signs within normal range and stable throughout visit. Laboratory/imaging studies significant for: See above 32 year old female presents emergency department with complaints of back pain present for the past 3 days.  Patient without any known injury to back but with some reproducible tenderness at the midline as well as paraspinal region in the lumbar spine as indicated above.  Patient without urinary symptoms but with possible CVA tenderness as well as multiple ED visits, CT imaging of the L-spine as well as renal stone study was ordered for nephrolithiasis assessment as well as imaging of patient's L-spine.  CT imaging for renal stone was negative.  CT  imaging lumbar spine concerning for bulging annulus at L4-L5, L5-S1 which is most likely attributing to patient's symptoms.  Patient without evidence of red flag signs on HPI/PE concerning for spinal cord compression, spinal epidural abscess, cauda equina syndrome, etc.  Patient without urinary symptoms with negative UA yesterday concerning for infection so low suspicion for pyelonephritis.  Will treat patient's symptoms with NSAIDs as well as muscle laxer as needed in the outpatient setting.  Will give patient information for spinal specialist in the outpatient setting for reevaluation.  Treatment plan discussed with patient and she did not understand was agreeable to set plan.  Patient over well-appearing, afebrile in no acute distress. Worrisome signs and symptoms were discussed with the patient, and the patient acknowledged understanding to return to the ED if noticed. Patient was stable upon discharge.          Final Clinical Impression(s) / ED  Diagnoses Final diagnoses:  Acute bilateral back pain, unspecified back location    Rx / DC Orders ED Discharge Orders          Ordered    ibuprofen (ADVIL) 600 MG tablet  Every 6 hours PRN        07/09/23 1648    cyclobenzaprine (FLEXERIL) 10 MG tablet  2 times daily PRN        07/09/23 1648              Peter Garter, [place] 07/09/23 1805    Coral Spikes, DO 07/10/23 1710

## 2023-07-09 NOTE — ED Notes (Signed)
Pt d/c home per MD order. Discharge summary reviewed with pt. Pt verbalizes understanding. Ambulatory off unit. No s/s of acute distress noted at discharge

## 2023-07-09 NOTE — ED Triage Notes (Signed)
Pt c/o back pain and nausea x 3 days. Recently treated for UTI but no longer have urinary symptoms. Pt states was seen at walk in clinic yesterday and was told to come to ER. Came here last night and left due to the long wait however saw her test results on mychart and noticed some abnormal labs and would like to be evaluated. PT states today is primarily back pain and headache.

## 2023-07-09 NOTE — Discharge Instructions (Addendum)
As discussed, workup today overall reassuring.  CT imaging did not show signs of kidney stone, infection/inflammation of the kidney.  CT imaging of your low back showed bulging disc in your lower back which could be causing your symptoms..  I suspect your symptoms are likely secondary to bulging disc versus muscular injury versus both.  Will treat this with NSAIDs, topical numbing patch as well as muscle laxer to use as needed in the outpatient setting.  Recommend follow-up with orthopedic spinal specialist in the outpatient setting.  Please do not hesitate to return to emergency department if there are worrisome signs and symptoms we discussed become apparent.

## 2023-07-10 ENCOUNTER — Ambulatory Visit: Payer: BC Managed Care – PPO | Admitting: Allergy & Immunology

## 2023-07-19 DIAGNOSIS — F4321 Adjustment disorder with depressed mood: Secondary | ICD-10-CM | POA: Diagnosis not present

## 2023-07-26 DIAGNOSIS — M5116 Intervertebral disc disorders with radiculopathy, lumbar region: Secondary | ICD-10-CM | POA: Diagnosis not present

## 2023-08-02 ENCOUNTER — Other Ambulatory Visit: Payer: Self-pay

## 2023-08-02 ENCOUNTER — Encounter: Payer: Self-pay | Admitting: Family Medicine

## 2023-08-02 ENCOUNTER — Ambulatory Visit: Payer: BC Managed Care – PPO | Admitting: Family Medicine

## 2023-08-02 VITALS — BP 120/72 | HR 98 | Temp 97.1°F | Resp 17 | Ht 65.55 in | Wt 227.6 lb

## 2023-08-02 DIAGNOSIS — J3089 Other allergic rhinitis: Secondary | ICD-10-CM

## 2023-08-02 DIAGNOSIS — H1013 Acute atopic conjunctivitis, bilateral: Secondary | ICD-10-CM | POA: Diagnosis not present

## 2023-08-02 DIAGNOSIS — J302 Other seasonal allergic rhinitis: Secondary | ICD-10-CM | POA: Diagnosis not present

## 2023-08-02 DIAGNOSIS — H1010 Acute atopic conjunctivitis, unspecified eye: Secondary | ICD-10-CM

## 2023-08-02 MED ORDER — MONTELUKAST SODIUM 10 MG PO TABS: 10.0000 mg | ORAL_TABLET | Freq: Every day | ORAL | 5 refills | Status: DC

## 2023-08-02 NOTE — Patient Instructions (Signed)
Allergic rhinitis Continue allergen avoidance measures directed toward grass pollen, cat, and dog as listed below Continue an antihistamine once a day as needed for runny nose or itch. Remember to rotate to a different antihistamine about every 3 months. Some examples of over the counter antihistamines include Zyrtec (cetirizine), Xyzal (levocetirizine), Allegra (fexofenadine), and Claritin (loratidine).  Restart montelukast 10 mg once a day to relieve allergy symptoms Consider saline nasal rinses as needed for nasal symptoms. Use this before any medicated nasal sprays for best result Consider allergen immunotherapy if your symptoms are not well-controlled with the treatment plan as listed above. Call the clinic to set up your first appointment if you are interested in allergy injections  Allergic conjunctivitis Continue olopatadine 1 drop in each eye once a day as needed for red or itchy eyes  Call the clinic if this treatment plan is not working well for you.  Follow up in 3 months or sooner if needed.  Reducing Pollen Exposure The American Academy of Allergy, Asthma and Immunology suggests the following steps to reduce your exposure to pollen during allergy seasons. Do not hang sheets or clothing out to dry; pollen may collect on these items. Do not mow lawns or spend time around freshly cut grass; mowing stirs up pollen. Keep windows closed at night.  Keep car windows closed while driving. Minimize morning activities outdoors, a time when pollen counts are usually at their highest. Stay indoors as much as possible when pollen counts or humidity is high and on windy days when pollen tends to remain in the air longer. Use air conditioning when possible.  Many air conditioners have filters that trap the pollen spores. Use a HEPA room air filter to remove pollen form the indoor air you breathe.  Control of Dog or Cat Allergen Avoidance is the best way to manage a dog or cat allergy. If you  have a dog or cat and are allergic to dog or cats, consider removing the dog or cat from the home. If you have a dog or cat but don't want to find it a new home, or if your family wants a pet even though someone in the household is allergic, here are some strategies that may help keep symptoms at bay:  Keep the pet out of your bedroom and restrict it to only a few rooms. Be advised that keeping the dog or cat in only one room will not limit the allergens to that room. Don't pet, hug or kiss the dog or cat; if you do, wash your hands with soap and water. High-efficiency particulate air (HEPA) cleaners run continuously in a bedroom or living room can reduce allergen levels over time. Regular use of a high-efficiency vacuum cleaner or a central vacuum can reduce allergen levels. Giving your dog or cat a bath at least once a week can reduce airborne allergen.

## 2023-08-02 NOTE — Progress Notes (Unsigned)
[address] [place] [address] Dept: [phone]  FOLLOW UP NOTE  Patient ID: Mia Ortiz, female    DOB: [birth year]  Age: 32 y.o. MRN: [id number] Date of Office Visit: 08/02/2023  Assessment  Chief Complaint: Allergic Rhinitis   HPI Mia Ortiz is a 32 year old female who presents to the clinic for follow-up visit.  She was last seen in this clinic on 06/13/2021 by Dr. Selena Batten as a new patient for evaluation of allergic rhinitis and allergic conjunctivitis.  Her last environmental allergy skin testing was on 06/13/2021 and was positive to grass pollen, cat, and dog.   Drug Allergies:  Allergies  Allergen Reactions   Famotidine Other (See Comments)    Headache    Physical Exam: BP 120/72 (BP Location: Left Arm, Patient Position: Sitting, Cuff Size: Large)   Pulse 98   Temp (!) 97.1 F (36.2 C) (Temporal)   Resp 17   Ht 5' 5.55" (1.665 m)   Wt 227 lb 9.6 oz (103.2 kg)   LMP 06/17/2023 (Approximate)   SpO2 97%   BMI 37.24 kg/m    Physical Exam  Diagnostics:    Assessment and Plan: No diagnosis found.  Meds ordered this encounter  Medications   montelukast (SINGULAIR) 10 MG tablet    Sig: Take 1 tablet (10 mg total) by mouth at bedtime.    Dispense:  30 tablet    Refill:  5    Patient Instructions  Allergic rhinitis Continue allergen avoidance measures directed toward grass pollen, cat, and dog as listed below Continue an antihistamine once a day as needed for runny nose or itch. Remember to rotate to a different antihistamine about every 3 months. Some examples of over the counter antihistamines include Zyrtec (cetirizine), Xyzal (levocetirizine), Allegra (fexofenadine), and Claritin (loratidine).  Restart montelukast 10 mg once a day to relieve allergy symptoms Consider saline nasal rinses as needed for nasal symptoms. Use this before any medicated nasal sprays for best result Consider allergen immunotherapy if your symptoms are  not well-controlled with the treatment plan as listed above. Call the clinic to set up your first appointment if you are interested in allergy injections  Allergic conjunctivitis Continue olopatadine 1 drop in each eye once a day as needed for red or itchy eyes  Call the clinic if this treatment plan is not working well for you.  Follow up in 3 months or sooner if needed.  Reducing Pollen Exposure The American Academy of Allergy, Asthma and Immunology suggests the following steps to reduce your exposure to pollen during allergy seasons. Do not hang sheets or clothing out to dry; pollen may collect on these items. Do not mow lawns or spend time around freshly cut grass; mowing stirs up pollen. Keep windows closed at night.  Keep car windows closed while driving. Minimize morning activities outdoors, a time when pollen counts are usually at their highest. Stay indoors as much as possible when pollen counts or humidity is high and on windy days when pollen tends to remain in the air longer. Use air conditioning when possible.  Many air conditioners have filters that trap the pollen spores. Use a HEPA room air filter to remove pollen form the indoor air you breathe.  Control of Dog or Cat Allergen Avoidance is the best way to manage a dog or cat allergy. If you have a dog or cat and are allergic to dog or cats, consider removing the dog or cat from the home. If you  have a dog or cat but don't want to find it a new home, or if your family wants a pet even though someone in the household is allergic, here are some strategies that may help keep symptoms at bay:  Keep the pet out of your bedroom and restrict it to only a few rooms. Be advised that keeping the dog or cat in only one room will not limit the allergens to that room. Don't pet, hug or kiss the dog or cat; if you do, wash your hands with soap and water. High-efficiency particulate air (HEPA) cleaners run continuously in a bedroom or  living room can reduce allergen levels over time. Regular use of a high-efficiency vacuum cleaner or a central vacuum can reduce allergen levels. Giving your dog or cat a bath at least once a week can reduce airborne allergen.   No follow-ups on file.    Thank you for the opportunity to care for this patient.  Please do not hesitate to contact me with questions.  Thermon Leyland, FNP [facility]

## 2023-08-04 ENCOUNTER — Encounter: Payer: Self-pay | Admitting: Family Medicine

## 2023-08-04 DIAGNOSIS — H1010 Acute atopic conjunctivitis, unspecified eye: Secondary | ICD-10-CM | POA: Insufficient documentation

## 2023-09-14 ENCOUNTER — Ambulatory Visit
Admission: EM | Admit: 2023-09-14 | Discharge: 2023-09-14 | Disposition: A | Discharge status: Home / Self Care | Payer: BC Managed Care – PPO | Attending: Family Medicine | Admitting: Family Medicine

## 2023-09-14 ENCOUNTER — Other Ambulatory Visit: Payer: Self-pay

## 2023-09-14 ENCOUNTER — Encounter: Payer: Self-pay | Admitting: Emergency Medicine

## 2023-09-14 DIAGNOSIS — Z1152 Encounter for screening for COVID-19: Secondary | ICD-10-CM | POA: Insufficient documentation

## 2023-09-14 DIAGNOSIS — J069 Acute upper respiratory infection, unspecified: Secondary | ICD-10-CM | POA: Diagnosis not present

## 2023-09-14 LAB — POCT INFLUENZA A/B
Influenza A, POC: NEGATIVE
Influenza B, POC: NEGATIVE

## 2023-09-14 MED ORDER — PROMETHAZINE-DM 6.25-15 MG/5ML PO SYRP: 5.0000 mL | ORAL_SOLUTION | Freq: Four times a day (QID) | ORAL | 0 refills | Status: DC | PRN

## 2023-09-14 NOTE — ED Triage Notes (Addendum)
Pt reports nasal congestion,cough, chills, sore throat, fatigue, intermittent body aches for last several days.

## 2023-09-15 LAB — SARS CORONAVIRUS 2 (TAT 6-24 HRS): SARS Coronavirus 2: NEGATIVE

## 2023-09-16 ENCOUNTER — Ambulatory Visit
Admission: RE | Admit: 2023-09-16 | Discharge: 2023-09-16 | Disposition: A | Discharge status: Home / Self Care | Payer: BC Managed Care – PPO | Source: Ambulatory Visit | Attending: Nurse Practitioner | Admitting: Nurse Practitioner

## 2023-09-16 VITALS — BP 108/75 | HR 100 | Temp 98.4°F | Resp 18

## 2023-09-16 DIAGNOSIS — J209 Acute bronchitis, unspecified: Secondary | ICD-10-CM

## 2023-09-16 MED ORDER — PREDNISONE 20 MG PO TABS: 40.0000 mg | ORAL_TABLET | Freq: Every day | ORAL | 0 refills | Status: AC

## 2023-09-16 MED ORDER — ALBUTEROL SULFATE HFA 108 (90 BASE) MCG/ACT IN AERS: 2.0000 | INHALATION_SPRAY | Freq: Four times a day (QID) | RESPIRATORY_TRACT | 2 refills | Status: DC | PRN

## 2023-09-16 NOTE — Discharge Instructions (Signed)
Take medication as prescribed.  You may continue the cough medicine previously prescribed. May take over-the-counter Tylenol or ibuprofen as needed for pain, fever, or general discomfort. Continue your current allergy medication. Increase fluids and allow for plenty of rest. Recommend using a humidifier in your bedroom at nighttime during sleep and sleeping elevated on pillows while cough symptoms persist. If symptoms do not improve over the next several days, or if your symptoms appear to be worsening, you may follow-up in this clinic or with your primary care physician for further evaluation. Follow-up as needed.

## 2023-09-16 NOTE — ED Provider Notes (Signed)
[facility]    CSN: [id number] Arrival date & time: 09/16/23  1256      History   Chief Complaint Chief Complaint  Patient presents with   URI    SOB, Chest pressure, wheezing, fatigue. Symptoms are no better since visit on 10/19 - Entered by patient   Cough    HPI Mia Ortiz is a 32 y.o. female.   The history is provided by the patient.   Patient presents for complaints of cough, fatigue, shortness of breath with exertion, chest tightness, and fatigue has been present for the past several days.  Patient was seen in this clinic 2 days ago and was prescribed Promethazine DM for her cough.  She states medicine has not helped with her symptoms.  States that the cough is "dry." She continues to state that she feels like she has a "burning" sensation in her chest.  She denies fever, chills, headache, sore throat, nasal congestion, runny nose, abdominal pain, or rash.    Past Medical History:  Diagnosis Date   Leukopenia 10/14/2013   Pre-diabetes     Patient Active Problem List   Diagnosis Date Noted   Seasonal allergic conjunctivitis 08/04/2023   Seasonal and perennial allergic rhinitis 06/13/2021   Allergic conjunctivitis of both eyes 06/13/2021   Leukopenia 10/14/2013    Past Surgical History:  Procedure Laterality Date   CHOLECYSTECTOMY N/A 01/27/2022   Procedure: LAPAROSCOPIC CHOLECYSTECTOMY;  Surgeon: Quentin Ore, MD;  Location: [facility] OR;  Service: General;  Laterality: N/A;    OB History   No obstetric history on file.      Home Medications    Prior to Admission medications   Medication Sig Start Date End Date Taking? Authorizing Provider  albuterol (VENTOLIN HFA) 108 (90 Base) MCG/ACT inhaler Inhale 2 puffs into the lungs every 6 (six) hours as needed for wheezing or shortness of breath. 09/16/23  Yes Leath-Warren, Sadie Haber, NP  escitalopram (LEXAPRO) 10 MG tablet Take 20 mg by mouth daily. 05/05/21  Yes [provider]   etonogestrel-ethinyl estradiol (NUVARING) 0.12-0.015 MG/24HR vaginal ring Place 1 each vaginally every 28 (twenty-eight) days.   Yes [provider]  gabapentin (NEURONTIN) 300 MG capsule Take 300 mg by mouth at bedtime.   Yes [provider]  predniSONE (DELTASONE) 20 MG tablet Take 2 tablets (40 mg total) by mouth daily with breakfast for 5 days. 09/16/23 09/21/23 Yes Leath-Warren, Sadie Haber, NP  promethazine-dextromethorphan (PROMETHAZINE-DM) 6.25-15 MG/5ML syrup Take 5 mLs by mouth 4 (four) times daily as needed. 09/14/23  Yes Particia Nearing, PA-C  buPROPion (WELLBUTRIN XL) 300 MG 24 hr tablet Take 300 mg by mouth as needed. Patient not taking: Reported on 08/02/2023 02/21/21   [provider]  cyclobenzaprine (FLEXERIL) 10 MG tablet Take 1 tablet (10 mg total) by mouth 2 (two) times daily as needed for muscle spasms. Patient not taking: Reported on 08/02/2023 07/09/23   Sherian Maroon A, PA  diphenhydrAMINE (BENADRYL) 25 MG tablet Take 25 mg by mouth at bedtime.    [provider]  HYDROcodone-acetaminophen (NORCO/VICODIN) 5-325 MG tablet Take 2 tablets by mouth every 4 (four) hours as needed. Patient not taking: Reported on 08/02/2023 12/17/22   Gareth Eagle, PA-C  ibuprofen (ADVIL) 600 MG tablet Take 1 tablet (600 mg total) by mouth every 6 (six) hours as needed. 07/09/23   Peter Garter, PA  MILI 0.25-35 MG-MCG tablet Take 1 tablet by mouth daily. Patient not taking: Reported on  08/02/2023 12/12/22   [provider]  montelukast (SINGULAIR) 10 MG tablet Take 1 tablet (10 mg total) by mouth at bedtime. 08/02/23   Hetty Blend, FNP  ondansetron (ZOFRAN) 4 MG tablet Take 1 tablet (4 mg total) by mouth every 6 (six) hours. Patient not taking: Reported on 08/02/2023 12/17/22   Gareth Eagle, PA-C  UNABLE TO FIND Take 10 mg by mouth daily as needed. Med Name: Aller-Tec    [provider]    Family History Family History  Problem  Relation Age of Onset   Cancer Paternal Uncle        lung cancer   Cancer Maternal Grandmother        cervical cancer   Cancer Maternal Grandfather        stomach ca    Social History Social History   Tobacco Use   Smoking status: Former    Current packs/day: 0.50    Average packs/day: 0.5 packs/day for 3.0 years (1.5 ttl pk-yrs)    Types: Cigarettes   Smokeless tobacco: Never  Vaping Use   Vaping status: Every Day  Substance Use Topics   Alcohol use: No   Drug use: No     Allergies   Famotidine   Review of Systems Review of Systems Per HPI  Physical Exam Triage Vital Signs ED Triage Vitals [09/16/23 1321]  Encounter Vitals Group     BP 108/75     Systolic BP Percentile      Diastolic BP Percentile      Pulse Rate 100     Resp 18     Temp 98.4 F (36.9 C)     Temp Source Oral     SpO2 98 %     Weight      Height      Head Circumference      Peak Flow      Pain Score 7     Pain Loc      Pain Education      Exclude from Growth Chart    No data found.  Updated Vital Signs BP 108/75 (BP Location: Left Arm)   Pulse 100   Temp 98.4 F (36.9 C) (Oral)   Resp 18   LMP 09/07/2023 (Approximate)   SpO2 98%   Visual Acuity Right Eye Distance:   Left Eye Distance:   Bilateral Distance:    Right Eye Near:   Left Eye Near:    Bilateral Near:     Physical Exam Vitals and nursing note reviewed.  Constitutional:      General: She is not in acute distress.    Appearance: Normal appearance.  HENT:     Head: Normocephalic.     Right Ear: Tympanic membrane, ear canal and external ear normal.     Left Ear: Tympanic membrane, ear canal and external ear normal.     Nose: Congestion present.     Comments: Cobblestoning present to posterior oropharynx     Mouth/Throat:     Mouth: Mucous membranes are moist.     Pharynx: Posterior oropharyngeal erythema present.  Eyes:     Extraocular Movements: Extraocular movements intact.     Conjunctiva/sclera:  Conjunctivae normal.     Pupils: Pupils are equal, round, and reactive to light.  Cardiovascular:     Rate and Rhythm: Normal rate and regular rhythm.     Pulses: Normal pulses.     Heart sounds: Normal heart sounds.  Pulmonary:  Effort: Pulmonary effort is normal. No respiratory distress.     Breath sounds: Normal breath sounds. No stridor. No wheezing, rhonchi or rales.  Chest:     Chest wall: No tenderness.  Abdominal:     General: Bowel sounds are normal.     Palpations: Abdomen is soft.     Tenderness: There is no abdominal tenderness.  Musculoskeletal:     Cervical back: Normal range of motion.  Lymphadenopathy:     Cervical: No cervical adenopathy.  Skin:    General: Skin is warm and dry.  Neurological:     General: No focal deficit present.     Mental Status: She is alert and oriented to person, place, and time.  Psychiatric:        Mood and Affect: Mood normal.        Behavior: Behavior normal.      UC Treatments / Results  Labs (all labs ordered are listed, but only abnormal results are displayed) Labs Reviewed - No data to display  EKG   Radiology No results found.  Procedures Procedures (including critical care time)  Medications Ordered in UC Medications - No data to display  Initial Impression / Assessment and Plan / UC Course  I have reviewed the triage vital signs and the nursing notes.  Pertinent labs & imaging results that were available during my care of the patient were reviewed by me and considered in my medical decision making (see chart for details).  Suspect bronchitis given patient's shortness of breath, and chest tightness.  Will treat with prednisone 40 mg for the next 5 days, along with an albuterol inhaler for shortness of breath and wheezing.  Supportive care recommendations were provided and discussed with the patient to include continuing previously prescribed cough medicine, over-the-counter analgesics, increasing fluids and  allowing for plenty of rest, use of a humidifier at nighttime during sleep.  Patient was given follow-up precautions.  Patient is in agreement with this plan of care and verbalizes understanding.  All questions were answered.  Patient stable for discharge.  Work note was provided.  Final Clinical Impressions(s) / UC Diagnoses   Final diagnoses:  Acute bronchitis, unspecified organism     Discharge Instructions      Take medication as prescribed.  You may continue the cough medicine previously prescribed. May take over-the-counter Tylenol or ibuprofen as needed for pain, fever, or general discomfort. Continue your current allergy medication. Increase fluids and allow for plenty of rest. Recommend using a humidifier in your bedroom at nighttime during sleep and sleeping elevated on pillows while cough symptoms persist. If symptoms do not improve over the next several days, or if your symptoms appear to be worsening, you may follow-up in this clinic or with your primary care physician for further evaluation. Follow-up as needed.     ED Prescriptions     Medication Sig Dispense Auth. Provider   albuterol (VENTOLIN HFA) 108 (90 Base) MCG/ACT inhaler Inhale 2 puffs into the lungs every 6 (six) hours as needed for wheezing or shortness of breath. 8 g Leath-Warren, Sadie Haber, NP   predniSONE (DELTASONE) 20 MG tablet Take 2 tablets (40 mg total) by mouth daily with breakfast for 5 days. 10 tablet Leath-Warren, Sadie Haber, NP      PDMP not reviewed this encounter.   Abran Cantor, NP 09/16/23 1350

## 2023-09-16 NOTE — ED Triage Notes (Signed)
Pt presents with cough, congestion, SOB with exertion, fatigue, chest tightness that started 5 days ago. Pt was seen saturday here and prescribed promethazine cough syrup and states it has not helped with symptoms.

## 2023-09-18 NOTE — ED Provider Notes (Signed)
[facility]    CSN: [id number] Arrival date & time: 09/14/23  1050      History   Chief Complaint Chief Complaint  Patient presents with   Nasal Congestion    HPI Mia Ortiz is a 32 y.o. female.   Presenting today with several day history of congestion, cough, sore throat, chills, fatigue, body aches.  Denies fever, chest pain, shortness of breath, vomiting, diarrhea.  So far taking over-the-counter remedies with minimal relief.  History of seasonal allergies on as needed regimen for this.  No known history of chronic pulmonary disease.    Past Medical History:  Diagnosis Date   Leukopenia 10/14/2013   Pre-diabetes     Patient Active Problem List   Diagnosis Date Noted   Seasonal allergic conjunctivitis 08/04/2023   Seasonal and perennial allergic rhinitis 06/13/2021   Allergic conjunctivitis of both eyes 06/13/2021   Leukopenia 10/14/2013    Past Surgical History:  Procedure Laterality Date   CHOLECYSTECTOMY N/A 01/27/2022   Procedure: LAPAROSCOPIC CHOLECYSTECTOMY;  Surgeon: Quentin Ore, MD;  Location: [facility] OR;  Service: General;  Laterality: N/A;    OB History   No obstetric history on file.      Home Medications    Prior to Admission medications   Medication Sig Start Date End Date Taking? Authorizing Provider  promethazine-dextromethorphan (PROMETHAZINE-DM) 6.25-15 MG/5ML syrup Take 5 mLs by mouth 4 (four) times daily as needed. 09/14/23  Yes Particia Nearing, PA-C  albuterol (VENTOLIN HFA) 108 (90 Base) MCG/ACT inhaler Inhale 2 puffs into the lungs every 6 (six) hours as needed for wheezing or shortness of breath. 09/16/23   Leath-Warren, Sadie Haber, NP  buPROPion (WELLBUTRIN XL) 300 MG 24 hr tablet Take 300 mg by mouth as needed. Patient not taking: Reported on 08/02/2023 02/21/21   [provider]  cyclobenzaprine (FLEXERIL) 10 MG tablet Take 1 tablet (10 mg total) by mouth 2 (two) times daily as needed for muscle  spasms. Patient not taking: Reported on 08/02/2023 07/09/23   Sherian Maroon A, PA  diphenhydrAMINE (BENADRYL) 25 MG tablet Take 25 mg by mouth at bedtime.    [provider]  escitalopram (LEXAPRO) 10 MG tablet Take 20 mg by mouth daily. 05/05/21   [provider]  etonogestrel-ethinyl estradiol (NUVARING) 0.12-0.015 MG/24HR vaginal ring Place 1 each vaginally every 28 (twenty-eight) days.    [provider]  gabapentin (NEURONTIN) 300 MG capsule Take 300 mg by mouth at bedtime.    [provider]  HYDROcodone-acetaminophen (NORCO/VICODIN) 5-325 MG tablet Take 2 tablets by mouth every 4 (four) hours as needed. Patient not taking: Reported on 08/02/2023 12/17/22   Gareth Eagle, PA-C  ibuprofen (ADVIL) 600 MG tablet Take 1 tablet (600 mg total) by mouth every 6 (six) hours as needed. 07/09/23   Peter Garter, PA  MILI 0.25-35 MG-MCG tablet Take 1 tablet by mouth daily. Patient not taking: Reported on 08/02/2023 12/12/22   [provider]  montelukast (SINGULAIR) 10 MG tablet Take 1 tablet (10 mg total) by mouth at bedtime. 08/02/23   Hetty Blend, FNP  ondansetron (ZOFRAN) 4 MG tablet Take 1 tablet (4 mg total) by mouth every 6 (six) hours. Patient not taking: Reported on 08/02/2023 12/17/22   Gareth Eagle, PA-C  predniSONE (DELTASONE) 20 MG tablet Take 2 tablets (40 mg total) by mouth daily with breakfast for 5 days. 09/16/23 09/21/23  Leath-Warren, Sadie Haber, NP  UNABLE TO FIND Take 10 mg by  mouth daily as needed. Med Name: Aller-Tec    [provider]    Family History Family History  Problem Relation Age of Onset   Cancer Paternal Uncle        lung cancer   Cancer Maternal Grandmother        cervical cancer   Cancer Maternal Grandfather        stomach ca    Social History Social History   Tobacco Use   Smoking status: Former    Current packs/day: 0.50    Average packs/day: 0.5 packs/day for 3.0 years (1.5 ttl pk-yrs)    Types:  Cigarettes   Smokeless tobacco: Never  Vaping Use   Vaping status: Every Day  Substance Use Topics   Alcohol use: No   Drug use: No     Allergies   Famotidine   Review of Systems Review of Systems Per HPI  Physical Exam Triage Vital Signs ED Triage Vitals  Encounter Vitals Group     BP 09/14/23 1103 111/81     Systolic BP Percentile --      Diastolic BP Percentile --      Pulse Rate 09/14/23 1103 90     Resp 09/14/23 1103 20     Temp 09/14/23 1103 98.6 F (37 C)     Temp Source 09/14/23 1103 Oral     SpO2 09/14/23 1103 96 %     Weight --      Height --      Head Circumference --      Peak Flow --      Pain Score 09/14/23 1102 7     Pain Loc --      Pain Education --      Exclude from Growth Chart --    No data found.  Updated Vital Signs BP 111/81 (BP Location: Right Arm)   Pulse 90   Temp 98.6 F (37 C) (Oral)   Resp 20   LMP 09/07/2023 (Approximate)   SpO2 96%   Visual Acuity Right Eye Distance:   Left Eye Distance:   Bilateral Distance:    Right Eye Near:   Left Eye Near:    Bilateral Near:     Physical Exam Vitals and nursing note reviewed.  Constitutional:      Appearance: Normal appearance.  HENT:     Head: Atraumatic.     Right Ear: Tympanic membrane and external ear normal.     Left Ear: Tympanic membrane and external ear normal.     Nose: Rhinorrhea present.     Mouth/Throat:     Mouth: Mucous membranes are moist.     Pharynx: Posterior oropharyngeal erythema present.  Eyes:     Extraocular Movements: Extraocular movements intact.     Conjunctiva/sclera: Conjunctivae normal.  Cardiovascular:     Rate and Rhythm: Normal rate and regular rhythm.     Heart sounds: Normal heart sounds.  Pulmonary:     Effort: Pulmonary effort is normal.     Breath sounds: Normal breath sounds. No wheezing or rales.  Musculoskeletal:        General: Normal range of motion.     Cervical back: Normal range of motion and neck supple.  Skin:     General: Skin is warm and dry.  Neurological:     Mental Status: She is alert and oriented to person, place, and time.  Psychiatric:        Mood and Affect: Mood normal.  Thought Content: Thought content normal.      UC Treatments / Results  Labs (all labs ordered are listed, but only abnormal results are displayed) Labs Reviewed  SARS CORONAVIRUS 2 (TAT 6-24 HRS)  POCT INFLUENZA A/B    EKG   Radiology No results found.  Procedures Procedures (including critical care time)  Medications Ordered in UC Medications - No data to display  Initial Impression / Assessment and Plan / UC Course  I have reviewed the triage vital signs and the nursing notes.  Pertinent labs & imaging results that were available during my care of the patient were reviewed by me and considered in my medical decision making (see chart for details).     Vitals and exam overall reassuring and suggestive of a viral respiratory infection.  Rapid flu negative, COVID testing pending.  Treat with Phenergan DM, supportive over-the-counter medications and home care.  Return for worsening symptoms.  Final Clinical Impressions(s) / UC Diagnoses   Final diagnoses:  Viral URI with cough   Discharge Instructions   None    ED Prescriptions     Medication Sig Dispense Auth. Provider   promethazine-dextromethorphan (PROMETHAZINE-DM) 6.25-15 MG/5ML syrup Take 5 mLs by mouth 4 (four) times daily as needed. 100 mL Particia Nearing, [place]      PDMP not reviewed this encounter.   Particia Nearing, [place] 09/18/23 1442

## 2023-09-30 DIAGNOSIS — N760 Acute vaginitis: Secondary | ICD-10-CM | POA: Diagnosis not present

## 2023-11-01 ENCOUNTER — Ambulatory Visit: Payer: BC Managed Care – PPO | Admitting: Family Medicine

## 2023-11-05 DIAGNOSIS — F4321 Adjustment disorder with depressed mood: Secondary | ICD-10-CM | POA: Diagnosis not present

## 2023-11-22 DIAGNOSIS — F4321 Adjustment disorder with depressed mood: Secondary | ICD-10-CM | POA: Diagnosis not present

## 2023-12-13 DIAGNOSIS — F4321 Adjustment disorder with depressed mood: Secondary | ICD-10-CM | POA: Diagnosis not present

## 2023-12-25 ENCOUNTER — Encounter: Payer: Self-pay | Admitting: Emergency Medicine

## 2023-12-25 ENCOUNTER — Ambulatory Visit
Admission: EM | Admit: 2023-12-25 | Discharge: 2023-12-25 | Disposition: A | Discharge status: Home / Self Care | Payer: BC Managed Care – PPO | Attending: Family Medicine | Admitting: Family Medicine

## 2023-12-25 DIAGNOSIS — J069 Acute upper respiratory infection, unspecified: Secondary | ICD-10-CM | POA: Diagnosis not present

## 2023-12-25 DIAGNOSIS — R000 Tachycardia, unspecified: Secondary | ICD-10-CM | POA: Diagnosis not present

## 2023-12-25 DIAGNOSIS — R509 Fever, unspecified: Secondary | ICD-10-CM | POA: Insufficient documentation

## 2023-12-25 LAB — POCT INFLUENZA A/B
Influenza A, POC: NEGATIVE
Influenza B, POC: NEGATIVE

## 2023-12-25 MED ORDER — FLUTICASONE PROPIONATE 50 MCG/ACT NA SUSP: 1.0000 | Freq: Two times a day (BID) | NASAL | 2 refills | Status: AC

## 2023-12-25 MED ORDER — PROMETHAZINE-DM 6.25-15 MG/5ML PO SYRP: 5.0000 mL | ORAL_SOLUTION | Freq: Four times a day (QID) | ORAL | 0 refills | Status: DC | PRN

## 2023-12-25 NOTE — Discharge Instructions (Signed)
Flu test was negative today, we have sent out a COVID test and that should be back tomorrow.  Someone will call if this comes back positive.  Continue the over-the-counter cold and congestion medications, ibuprofen and Tylenol for fever and bodyaches and I have sent over a cough syrup and a nasal spray.  Follow-up for significantly worsening symptoms.

## 2023-12-25 NOTE — ED Provider Notes (Signed)
[facility]    CSN: [id number] Arrival date & time: 12/25/23  4098      History   Chief Complaint No chief complaint on file.   HPI Mia Ortiz is a 33 y.o. female.   Patient resenting today with 1 day history of fever, chills, body aches, weakness, fatigue, headaches, congestion, cough, sneezing.  Denies chest pain, shortness of breath, abdominal pain, vomiting, diarrhea.  So far trying Tylenol and over-the-counter remedies with mild temporary benefit.    Past Medical History:  Diagnosis Date   Leukopenia 10/14/2013   Pre-diabetes     Patient Active Problem List   Diagnosis Date Noted   Seasonal allergic conjunctivitis 08/04/2023   Seasonal and perennial allergic rhinitis 06/13/2021   Allergic conjunctivitis of both eyes 06/13/2021   Leukopenia 10/14/2013    Past Surgical History:  Procedure Laterality Date   CHOLECYSTECTOMY N/A 01/27/2022   Procedure: LAPAROSCOPIC CHOLECYSTECTOMY;  Surgeon: Quentin Ore, MD;  Location: [facility];  Service: General;  Laterality: N/A;    OB History   No obstetric history on file.      Home Medications    Prior to Admission medications   Medication Sig Start Date End Date Taking? Authorizing Provider  fluticasone (FLONASE) 50 MCG/ACT nasal spray Place 1 spray into both nostrils 2 (two) times daily. 12/25/23  Yes Particia Nearing, PA-C  promethazine-dextromethorphan (PROMETHAZINE-DM) 6.25-15 MG/5ML syrup Take 5 mLs by mouth 4 (four) times daily as needed. 12/25/23  Yes Particia Nearing, PA-C  albuterol (VENTOLIN HFA) 108 (90 Base) MCG/ACT inhaler Inhale 2 puffs into the lungs every 6 (six) hours as needed for wheezing or shortness of breath. 09/16/23   Leath-Warren, Sadie Haber, NP  diphenhydrAMINE (BENADRYL) 25 MG tablet Take 25 mg by mouth at bedtime.    [provider]  escitalopram (LEXAPRO) 10 MG tablet Take 20 mg by mouth daily. 05/05/21   [provider]  etonogestrel-ethinyl  estradiol (NUVARING) 0.12-0.015 MG/24HR vaginal ring Place 1 each vaginally every 28 (twenty-eight) days.    [provider]  gabapentin (NEURONTIN) 300 MG capsule Take 300 mg by mouth at bedtime.    [provider]  ibuprofen (ADVIL) 600 MG tablet Take 1 tablet (600 mg total) by mouth every 6 (six) hours as needed. 07/09/23   Peter Garter, PA  MILI 0.25-35 MG-MCG tablet Take 1 tablet by mouth daily. Patient not taking: Reported on 08/02/2023 12/12/22   [provider]  UNABLE TO FIND Take 10 mg by mouth daily as needed. Med Name: Aller-Tec    [provider]    Family History Family History  Problem Relation Age of Onset   Cancer Paternal Uncle        lung cancer   Cancer Maternal Grandmother        cervical cancer   Cancer Maternal Grandfather        stomach ca    Social History Social History   Tobacco Use   Smoking status: Former    Current packs/day: 0.50    Average packs/day: 0.5 packs/day for 3.0 years (1.5 ttl pk-yrs)    Types: Cigarettes   Smokeless tobacco: Never  Vaping Use   Vaping status: Every Day  Substance Use Topics   Alcohol use: No   Drug use: No     Allergies   Famotidine   Review of Systems Review of Systems Per HPI  Physical Exam Triage Vital Signs ED Triage Vitals  Encounter Vitals Group  BP 12/25/23 0843 123/77     Systolic BP Percentile --      Diastolic BP Percentile --      Pulse Rate 12/25/23 0843 (!) 101     Resp 12/25/23 0843 18     Temp 12/25/23 0843 98.6 F (37 C)     Temp Source 12/25/23 0843 Oral     SpO2 12/25/23 0843 98 %     Weight --      Height --      Head Circumference --      Peak Flow --      Pain Score 12/25/23 0847 6     Pain Loc --      Pain Education --      Exclude from Growth Chart --    No data found.  Updated Vital Signs BP 123/77 (BP Location: Right Arm)   Pulse (!) 101   Temp 98.6 F (37 C) (Oral)   Resp 18   LMP 11/28/2023 (Approximate)   SpO2 98%    Visual Acuity Right Eye Distance:   Left Eye Distance:   Bilateral Distance:    Right Eye Near:   Left Eye Near:    Bilateral Near:     Physical Exam Vitals and nursing note reviewed.  Constitutional:      Appearance: Normal appearance.  HENT:     Head: Atraumatic.     Right Ear: Tympanic membrane and external ear normal.     Left Ear: Tympanic membrane and external ear normal.     Nose: Rhinorrhea present.     Mouth/Throat:     Mouth: Mucous membranes are moist.     Pharynx: Posterior oropharyngeal erythema present.  Eyes:     Extraocular Movements: Extraocular movements intact.     Conjunctiva/sclera: Conjunctivae normal.  Cardiovascular:     Rate and Rhythm: Normal rate and regular rhythm.     Heart sounds: Normal heart sounds.  Pulmonary:     Effort: Pulmonary effort is normal.     Breath sounds: Normal breath sounds. No wheezing or rales.  Musculoskeletal:        General: Normal range of motion.     Cervical back: Normal range of motion and neck supple.  Skin:    General: Skin is warm and dry.  Neurological:     Mental Status: She is alert and oriented to person, place, and time.  Psychiatric:        Mood and Affect: Mood normal.        Thought Content: Thought content normal.      UC Treatments / Results  Labs (all labs ordered are listed, but only abnormal results are displayed) Labs Reviewed  SARS CORONAVIRUS 2 (TAT 6-24 HRS)  POCT INFLUENZA A/B    EKG   Radiology No results found.  Procedures Procedures (including critical care time)  Medications Ordered in UC Medications - No data to display  Initial Impression / Assessment and Plan / UC Course  I have reviewed the triage vital signs and the nursing notes.  Pertinent labs & imaging results that were available during my care of the patient were reviewed by me and considered in my medical decision making (see chart for details).     Minimally tachycardic in triage, otherwise vital  signs reassuring.  She has had home fevers.  Suspect viral respiratory infection.  Rapid flu negative, COVID testing pending.  Treat with Phenergan DM, Flonase, supportive over-the-counter medications and home care.  Return for worsening  symptoms.  Final Clinical Impressions(s) / UC Diagnoses   Final diagnoses:  Viral URI with cough  Fever, unspecified  Tachycardia     Discharge Instructions      Flu test was negative today, we have sent out a COVID test and that should be back tomorrow.  Someone will call if this comes back positive.  Continue the over-the-counter cold and congestion medications, ibuprofen and Tylenol for fever and bodyaches and I have sent over a cough syrup and a nasal spray.  Follow-up for significantly worsening symptoms.    ED Prescriptions     Medication Sig Dispense Auth. Provider   promethazine-dextromethorphan (PROMETHAZINE-DM) 6.25-15 MG/5ML syrup Take 5 mLs by mouth 4 (four) times daily as needed. 100 mL Particia Nearing, PA-C   fluticasone [facility]) 50 MCG/ACT nasal spray Place 1 spray into both nostrils 2 (two) times daily. 16 g Particia Nearing, [place]      PDMP not reviewed this encounter.   Particia Nearing, [place] 12/25/23 1016

## 2023-12-25 NOTE — ED Triage Notes (Signed)
Sneezing, nasal congestion, fever, body aches, feels weak since yesterday.  Headache and chills today.

## 2023-12-26 LAB — SARS CORONAVIRUS 2 (TAT 6-24 HRS): SARS Coronavirus 2: NEGATIVE

## 2024-01-01 ENCOUNTER — Encounter: Payer: Self-pay | Admitting: Nurse Practitioner

## 2024-01-01 ENCOUNTER — Ambulatory Visit: Payer: BC Managed Care – PPO | Admitting: Nurse Practitioner

## 2024-01-01 VITALS — BP 120/60 | HR 97 | Temp 98.6°F | Ht 65.0 in | Wt 227.0 lb

## 2024-01-01 DIAGNOSIS — R7303 Prediabetes: Secondary | ICD-10-CM | POA: Diagnosis not present

## 2024-01-01 DIAGNOSIS — D72819 Decreased white blood cell count, unspecified: Secondary | ICD-10-CM | POA: Diagnosis not present

## 2024-01-01 DIAGNOSIS — E66811 Obesity, class 1: Secondary | ICD-10-CM

## 2024-01-01 DIAGNOSIS — Z7689 Persons encountering health services in other specified circumstances: Secondary | ICD-10-CM

## 2024-01-01 DIAGNOSIS — Z2821 Immunization not carried out because of patient refusal: Secondary | ICD-10-CM

## 2024-01-01 DIAGNOSIS — Z6833 Body mass index (BMI) 33.0-33.9, adult: Secondary | ICD-10-CM

## 2024-01-01 DIAGNOSIS — E6609 Other obesity due to excess calories: Secondary | ICD-10-CM | POA: Diagnosis not present

## 2024-01-01 MED ORDER — WEGOVY 0.25 MG/0.5ML ~~LOC~~ SOAJ
0.2500 mg | SUBCUTANEOUS | 1 refills | Status: DC
Start: 2024-01-01 — End: 2024-02-04

## 2024-01-01 NOTE — Progress Notes (Signed)
 Mia Ortiz, CMA,acting as a neurosurgeon for [name], FNP.,have documented all relevant documentation on the behalf of [name], FNP,as directed by  [name], FNP while in the presence of [name], FNP.  Subjective:  Patient ID: Mia Ortiz , female    DOB: 04/05/1991 , 33 y.o.   MRN: [id number]  Chief Complaint  Patient presents with   Establish Care    HPI  Patient presents today to establish care. She was going to [place] at [facility], last seen last year. She works as a public relations account executive at [employer]. Single. No children.   She patient reports she was told she was pre-diabetic a few months ago. Patient also reports her WBC is normally high as well she is unsure why. She has seen a Hematologist when she was a smoker. She quit smoking she will still have some elevated white counts.  Mother - hyperlipidemia. Father - does not go to doctor. Brother - healthy  Patient reports she is established with a veterinary surgeon.   Patient would also like to discuss weight loss options, patient has tried and failed diet but is unable to exercise due to 2 herniated disk in her back. She is being followed [facility] with Dr. Donnajean Eddy. She is currently in PT then will get MRI done once complete - may have surgery. She can only walk for 30 minutes - she will have back pain and leg numbness. She can do the recumbent bike. Diet: regular and she does large portions. She does not snack. She may binge eat and then will not eat much for the next few days. She has never been on any medications for weight loss. She can not lift or pull anything right now.   Patient's last menstrual period was 11/28/2023 (approximate).   [facility].     Past Medical History:  Diagnosis Date   Leukopenia 10/14/2013   Pre-diabetes      Family History  Problem Relation Age of Onset   Hyperlipidemia Mother    Cancer Paternal Uncle         lung cancer   Cancer Maternal Grandmother        cervical cancer   Cancer Maternal Grandfather        stomach ca     Current Outpatient Medications:    albuterol  (VENTOLIN  HFA) 108 (90 Base) MCG/ACT inhaler, Inhale 2 puffs into the lungs every 6 (six) hours as needed for wheezing or shortness of breath., Disp: 8 g, Rfl: 2   diphenhydrAMINE  (BENADRYL ) 25 MG tablet, Take 25 mg by mouth at bedtime., Disp: , Rfl:    escitalopram  (LEXAPRO ) 10 MG tablet, Take 20 mg by mouth daily., Disp: , Rfl:    etonogestrel-ethinyl estradiol (NUVARING) 0.12-0.015 MG/24HR vaginal ring, Place 1 each vaginally every 28 (twenty-eight) days., Disp: , Rfl:    gabapentin (NEURONTIN) 300 MG capsule, Take 300 mg by mouth at bedtime., Disp: , Rfl:    ibuprofen  (ADVIL ) 600 MG tablet, Take 1 tablet (600 mg total) by mouth every 6 (six) hours as needed., Disp: 30 tablet, Rfl: 0   Semaglutide -Weight Management (WEGOVY ) 0.25 MG/0.5ML SOAJ, Inject 0.25 mg into the skin once a week., Disp: 2 mL, Rfl: 1   fluticasone  (FLONASE ) 50 MCG/ACT nasal spray, Place 1 spray into both nostrils 2 (two) times daily. (Patient not taking: Reported on 01/01/2024), Disp: 16 g, Rfl: 2   MILI 0.25-35 MG-MCG tablet, Take 1  tablet by mouth daily. (Patient not taking: Reported on 01/01/2024), Disp: , Rfl:    Allergies  Allergen Reactions   Famotidine  Other (See Comments)    Headache     Review of Systems  Constitutional: Negative.  Negative for activity change and fatigue.  Eyes:  Negative for visual disturbance.  Respiratory: Negative.  Negative for choking, shortness of breath and wheezing.   Cardiovascular: Negative.  Negative for chest pain, palpitations and leg swelling.  Gastrointestinal: Negative.   Endocrine: Negative.  Negative for polydipsia, polyphagia and polyuria.  Musculoskeletal: Negative.   Skin: Negative.   Neurological:  Negative for dizziness, weakness and headaches.  Psychiatric/Behavioral:  Negative for confusion.  The patient is not nervous/anxious.      Today's Vitals   01/01/24 1522  BP: 120/60  Pulse: 97  Temp: 98.6 F (37 C)  TempSrc: Oral  Weight: 227 lb (103 kg)  Height: 5' 5 (1.651 m)  PainSc: 0-No pain   Body mass index is 37.77 kg/m.  Wt Readings from Last 3 Encounters:  01/01/24 227 lb (103 kg)  08/02/23 227 lb 9.6 oz (103.2 kg)  07/09/23 231 lb (104.8 kg)     Objective:  Physical Exam Vitals reviewed.  Constitutional:      General: She is not in acute distress.    Appearance: Normal appearance.  Cardiovascular:     Rate and Rhythm: Normal rate and regular rhythm.     Pulses: Normal pulses.     Heart sounds: Normal heart sounds. No murmur heard. Pulmonary:     Effort: Pulmonary effort is normal. No respiratory distress.     Breath sounds: Normal breath sounds. No wheezing.  Neurological:     Mental Status: She is alert.         Assessment And Plan:  Establishing care with new doctor, encounter for Assessment & Plan: Patient is here to establish care. Went over patient medical, family, social and surgical history. Reviewed with patient their medications and any allergies  Reviewed with patient their sexual orientation, drug/tobacco and alcohol use Dicussed any new concerns with patient  recommended patient comes in for a physical exam and complete blood work.  Educated patient about the importance of annual screenings and immunizations.  Advised patient to eat a healthy diet along with exercise for atleast 30-45 min at least 4-5 days of the week.     Prediabetes Assessment & Plan: Will check HgbA1c.   Orders: -     Hemoglobin A1c  Leukopenia, unspecified type Assessment & Plan: Will recheck her CBC.   Orders: -     CBC with Differential/Platelet  Class 1 obesity due to excess calories without serious comorbidity with body mass index (BMI) of 33.0 to 33.9 in adult Assessment & Plan: She is encouraged to strive for BMI less than 30 to decrease  cardiac risk. Advised to aim for at least 150 minutes of exercise per week.   Orders: -     TSH -     Wegovy ; Inject 0.25 mg into the skin once a week.  Dispense: 2 mL; Refill: 1  Influenza vaccination declined Assessment & Plan: Patient declined influenza vaccination at this time. Patient is aware that influenza vaccine prevents illness in 70% of healthy people, and reduces hospitalizations to 30-70% in elderly. This vaccine is recommended annually. Education has been provided regarding the importance of this vaccine but patient still declined. Advised may receive this vaccine at local pharmacy or Health Dept.or vaccine clinic. Aware to provide a  copy of the vaccination record if obtained from local pharmacy or Health Dept.  Pt is willing to accept risk associated with refusing vaccination.    COVID-19 vaccination declined Assessment & Plan: Declines covid 19 vaccine. Discussed risk of covid 26 and if she changes her mind about the vaccine to call the office. Education has been provided regarding the importance of this vaccine but patient still declined. Advised may receive this vaccine at local pharmacy or Health Dept.or vaccine clinic. Aware to provide a copy of the vaccination record if obtained from local pharmacy or Health Dept.  Encouraged to take multivitamin, vitamin d, vitamin c and zinc to increase immune system. Aware can call office if would like to have vaccine here at office. Verbalized acceptance and understanding.    Tetanus, diphtheria, and acellular pertussis (Tdap) vaccination declined    Return in about 5 months (around 05/30/2024) for phy when able: 2 month weight check.  Patient was given opportunity to ask questions. Patient verbalized understanding of the plan and was able to repeat key elements of the plan. All questions were answered to their satisfaction.    Mia [name], FNP, have reviewed all documentation for this visit. The documentation on 01/01/24 for the  exam, diagnosis, procedures, and orders are all accurate and complete.   IF YOU HAVE BEEN REFERRED TO A SPECIALIST, IT MAY TAKE 1-2 WEEKS TO SCHEDULE/PROCESS THE REFERRAL. IF YOU HAVE NOT HEARD FROM US /SPECIALIST IN TWO WEEKS, PLEASE GIVE US  A CALL AT [phone].

## 2024-01-01 NOTE — Patient Instructions (Signed)
Goal to exercise 150 minutes per week as tolerated Encouraged to park further when at the store, take stairs instead of elevators and to walk in place during commercials when possible. Increase water intake to at least one gallon of water daily.

## 2024-01-02 ENCOUNTER — Encounter: Payer: Self-pay | Admitting: Nurse Practitioner

## 2024-01-02 LAB — CBC WITH DIFFERENTIAL/PLATELET
Basophils Absolute: 0.1 10*3/uL (ref 0.0–0.2)
Basos: 1 %
EOS (ABSOLUTE): 0.4 10*3/uL (ref 0.0–0.4)
Eos: 4 %
Hematocrit: 39.1 % (ref 34.0–46.6)
Hemoglobin: 13.5 g/dL (ref 11.1–15.9)
Immature Grans (Abs): 0.1 10*3/uL (ref 0.0–0.1)
Immature Granulocytes: 1 %
Lymphocytes Absolute: 3 10*3/uL (ref 0.7–3.1)
Lymphs: 29 %
MCH: 29.8 pg (ref 26.6–33.0)
MCHC: 34.5 g/dL (ref 31.5–35.7)
MCV: 86 fL (ref 79–97)
Monocytes Absolute: 0.7 10*3/uL (ref 0.1–0.9)
Monocytes: 7 %
Neutrophils Absolute: 6.2 10*3/uL (ref 1.4–7.0)
Neutrophils: 58 %
Platelets: 381 10*3/uL (ref 150–450)
RBC: 4.53 x10E6/uL (ref 3.77–5.28)
RDW: 12.6 % (ref 11.7–15.4)
WBC: 10.5 10*3/uL (ref 3.4–10.8)

## 2024-01-02 LAB — HEMOGLOBIN A1C
Est. average glucose Bld gHb Est-mCnc: 105 mg/dL
Hgb A1c MFr Bld: 5.3 % (ref 4.8–5.6)

## 2024-01-02 LAB — TSH: TSH: 1.4 u[IU]/mL (ref 0.450–4.500)

## 2024-01-12 DIAGNOSIS — R7303 Prediabetes: Secondary | ICD-10-CM | POA: Insufficient documentation

## 2024-01-12 DIAGNOSIS — Z7689 Persons encountering health services in other specified circumstances: Secondary | ICD-10-CM | POA: Insufficient documentation

## 2024-01-12 DIAGNOSIS — Z2821 Immunization not carried out because of patient refusal: Secondary | ICD-10-CM | POA: Insufficient documentation

## 2024-01-12 DIAGNOSIS — E66811 Obesity, class 1: Secondary | ICD-10-CM | POA: Insufficient documentation

## 2024-01-12 NOTE — Assessment & Plan Note (Signed)
 She is encouraged to strive for BMI less than 30 to decrease cardiac risk. Advised to aim for at least 150 minutes of exercise per week.

## 2024-01-12 NOTE — Assessment & Plan Note (Signed)

## 2024-01-12 NOTE — Assessment & Plan Note (Signed)

## 2024-01-12 NOTE — Assessment & Plan Note (Signed)
Will check Hgb A1c 

## 2024-01-12 NOTE — Assessment & Plan Note (Signed)

## 2024-01-12 NOTE — Assessment & Plan Note (Signed)
 Will recheck her CBC.

## 2024-02-03 ENCOUNTER — Ambulatory Visit: Payer: Self-pay | Admitting: Nurse Practitioner

## 2024-02-03 NOTE — Telephone Encounter (Signed)
 Copied from CRM [id number]. Topic: Clinical - Red Word Triage >> Feb 03, 2024  1:10 PM Gery Pray wrote: [facility] that prompted transfer to Nurse Triage: Patient has been having migraines that has been worsening. Patient states that when she has her migraines they are a 9. She begins losing vision and experiencing Vertigo.   Chief Complaint: Headache  Symptoms: Headache, dizziness, vision changes (no symptoms currently) Frequency: Intermittent  Pertinent Negatives: Patient denies any current symptoms  Disposition: [] ED /[] Urgent Care (no appt availability in office) / [x] Appointment(In office/virtual)/ []  [place] [facility]/ [] Home Care/ [] Refused Recommended Disposition /[] [place] [facility]/ []  Follow-up with PCP Additional Notes: Patient reports a history of migraines and states that she has been having an increased amount of headaches over the last couple of weeks. She states her last headache was 4 days ago. She states when she has the headaches she also experiences dizziness and changes to her vision. Appointment made for the patient on Wednesday. Patient instructed to call back for new or worsening symptoms. Patient verbalized understanding and agreement with this plan.     Reason for Disposition  Headache is a chronic symptom (recurrent or ongoing AND present > 4 weeks)  Answer Assessment - Initial Assessment Questions 1. LOCATION: "Where does it hurt?"      No headache currently  2. ONSET: "When did the headache start?" (Minutes, hours or days)      Last one was 4 days ago  3. PATTERN: "Does the pain come and go, or has it been constant since it started?"     Intermittent  4. SEVERITY: "How bad is the pain?" and "What does it keep you from doing?"  (e.g., Scale 1-10; mild, moderate, or severe)   - MILD (1-3): doesn't interfere with normal activities    - MODERATE (4-7): interferes with normal activities or awakens from sleep    - SEVERE (8-10): excruciating pain, unable to do  any normal activities        9/10 5. RECURRENT SYMPTOM: "Have you ever had headaches before?" If Yes, ask: "When was the last time?" and "What happened that time?"      Yes 6. CAUSE: "What do you think is causing the headache?"     History of migraines  7. MIGRAINE: "Have you been diagnosed with migraine headaches?" If Yes, ask: "Is this headache similar?"      Yes 8. HEAD INJURY: "Has there been any recent injury to the head?"      No 9. OTHER SYMPTOMS: "Do you have any other symptoms?" (fever, stiff neck, eye pain, sore throat, cold symptoms)     Vision changes and vertigo when headache present  10. PREGNANCY: "Is there any chance you are pregnant?" "When was your last menstrual period?"       No  Protocols used: Headache-A-AH

## 2024-02-04 ENCOUNTER — Ambulatory Visit: Payer: Self-pay | Admitting: Nurse Practitioner

## 2024-02-04 ENCOUNTER — Encounter: Payer: Self-pay | Admitting: Nurse Practitioner

## 2024-02-04 VITALS — BP 110/70 | HR 88 | Temp 98.5°F | Ht 65.0 in | Wt 227.8 lb

## 2024-02-04 DIAGNOSIS — E66812 Obesity, class 2: Secondary | ICD-10-CM | POA: Insufficient documentation

## 2024-02-04 DIAGNOSIS — Z2821 Immunization not carried out because of patient refusal: Secondary | ICD-10-CM

## 2024-02-04 DIAGNOSIS — E6609 Other obesity due to excess calories: Secondary | ICD-10-CM | POA: Diagnosis not present

## 2024-02-04 DIAGNOSIS — R519 Headache, unspecified: Secondary | ICD-10-CM | POA: Insufficient documentation

## 2024-02-04 DIAGNOSIS — Z6837 Body mass index (BMI) 37.0-37.9, adult: Secondary | ICD-10-CM | POA: Diagnosis not present

## 2024-02-04 MED ORDER — SUMATRIPTAN SUCCINATE 50 MG PO TABS: 50.0000 mg | ORAL_TABLET | Freq: Once | ORAL | 2 refills | Status: AC | PRN

## 2024-02-04 NOTE — Patient Instructions (Signed)
 https://americanmigrainefoundation.org/resource-library/diet/ - this link has information on foods to avoid and additional information on migraines.

## 2024-02-04 NOTE — Assessment & Plan Note (Signed)

## 2024-02-04 NOTE — Assessment & Plan Note (Signed)
 She is encouraged to strive for BMI less than 30 to decrease cardiac risk. Advised to aim for at least 150 minutes of exercise per week. She was denied [place] and will call her insurance company to see what is covered.

## 2024-02-04 NOTE — Assessment & Plan Note (Signed)
 Has been occurring every few months, has photophobia, nausea, generalized weakness and persistent headache. Will treat with sumitriptan as needed. Encouraged to keep a headache log. These are likely migraines

## 2024-02-04 NOTE — Progress Notes (Signed)
 Madelaine Bhat, CMA,acting as a Neurosurgeon for Arnette Felts, FNP.,have documented all relevant documentation on the behalf of Arnette Felts, FNP,as directed by  Arnette Felts, FNP while in the presence of Arnette Felts, FNP.  Subjective:  Patient ID: Mia Ortiz , female    DOB: 05/18/1991 , 33 y.o.   MRN: [id number]  Chief Complaint  Patient presents with   Headache    HPI  Patient presents today for headaches, patient reports she has had them on and off for about a year. She reports she had her last one Thursday; she has lingering effects.  Patient reports they are getting worse she described them as debilitating. She will have dizziness and nausea. Patient reports today she has pressure in the back of her head that starts in her neck and goes to about the middle of her head. She has not seen a provider for these in the past. She was alternating with tylenol/ibuprofen. She had another migraine a few months ago and took excedrin migraine. No significant weight gain prior. She will get a headache every two months. She can tell when the headache starts with tension to her neck. She feels brain fogged afterwards.      Past Medical History:  Diagnosis Date   Leukopenia 10/14/2013   Pre-diabetes      Family History  Problem Relation Age of Onset   Hyperlipidemia Mother    Cancer Paternal Uncle        lung cancer   Cancer Maternal Grandmother        cervical cancer   Cancer Maternal Grandfather        stomach ca     Current Outpatient Medications:    albuterol (VENTOLIN HFA) 108 (90 Base) MCG/ACT inhaler, Inhale 2 puffs into the lungs every 6 (six) hours as needed for wheezing or shortness of breath., Disp: 8 g, Rfl: 2   diphenhydrAMINE (BENADRYL) 25 MG tablet, Take 25 mg by mouth at bedtime., Disp: , Rfl:    escitalopram (LEXAPRO) 10 MG tablet, Take 20 mg by mouth daily., Disp: , Rfl:    etonogestrel-ethinyl estradiol (NUVARING) 0.12-0.015 MG/24HR vaginal ring, Place 1 each  vaginally every 28 (twenty-eight) days., Disp: , Rfl:    gabapentin (NEURONTIN) 300 MG capsule, Take 300 mg by mouth at bedtime., Disp: , Rfl:    ibuprofen (ADVIL) 600 MG tablet, Take 1 tablet (600 mg total) by mouth every 6 (six) hours as needed., Disp: 30 tablet, Rfl: 0   SUMAtriptan (IMITREX) 50 MG tablet, Take 1 tablet (50 mg total) by mouth once as needed for migraine. May repeat in 2 hours if headache persists or recurs., Disp: 30 tablet, Rfl: 2   fluticasone (FLONASE) 50 MCG/ACT nasal spray, Place 1 spray into both nostrils 2 (two) times daily. (Patient not taking: Reported on 02/04/2024), Disp: 16 g, Rfl: 2   Allergies  Allergen Reactions   Famotidine Other (See Comments)    Headache     Review of Systems  Constitutional: Negative.  Negative for activity change and fatigue.  Eyes:  Negative for visual disturbance.  Respiratory: Negative.  Negative for choking, shortness of breath and wheezing.   Cardiovascular: Negative.  Negative for chest pain, palpitations and leg swelling.  Gastrointestinal: Negative.   Endocrine: Negative.  Negative for polydipsia, polyphagia and polyuria.  Musculoskeletal: Negative.   Skin: Negative.   Neurological:  Positive for weakness (generalized) and headaches. Negative for dizziness, light-headedness and numbness.  Psychiatric/Behavioral:  Negative for confusion. The patient is  not nervous/anxious.      Today's Vitals   02/04/24 1039  BP: 110/70  Pulse: 88  Temp: 98.5 F (36.9 C)  TempSrc: Oral  Weight: 227 lb 12.8 oz (103.3 kg)  Height: 5\' 5"  (1.651 m)  PainSc: 0-No pain   Body mass index is 37.91 kg/m.  Wt Readings from Last 3 Encounters:  02/04/24 227 lb 12.8 oz (103.3 kg)  01/01/24 227 lb (103 kg)  08/02/23 227 lb 9.6 oz (103.2 kg)     Objective:  Physical Exam Vitals and nursing note reviewed.  Constitutional:      General: She is not in acute distress.    Appearance: Normal appearance. She is well-developed. She is obese.   Cardiovascular:     Rate and Rhythm: Normal rate and regular rhythm.     Pulses: Normal pulses.     Heart sounds: Normal heart sounds. No murmur heard. Pulmonary:     Effort: Pulmonary effort is normal. No respiratory distress.     Breath sounds: Normal breath sounds. No wheezing.  Neurological:     Mental Status: She is alert.  Psychiatric:        Mood and Affect: Mood normal. Mood is not anxious.        Speech: Speech normal.        Behavior: Behavior normal. Behavior is not agitated.         Assessment And Plan:  Acute nonintractable headache, unspecified headache type Assessment & Plan: Has been occurring every few months, has photophobia, nausea, generalized weakness and persistent headache. Will treat with sumitriptan as needed. Encouraged to keep a headache log. These are likely migraines  Orders: -     SUMAtriptan Succinate; Take 1 tablet (50 mg total) by mouth once as needed for migraine. May repeat in 2 hours if headache persists or recurs.  Dispense: 30 tablet; Refill: 2  COVID-19 vaccination declined Assessment & Plan: Declines covid 19 vaccine. Discussed risk of covid 51 and if she changes her mind about the vaccine to call the office. Education has been provided regarding the importance of this vaccine but patient still declined. Advised may receive this vaccine at local pharmacy or Health Dept.or vaccine clinic. Aware to provide a copy of the vaccination record if obtained from local pharmacy or Health Dept.  Encouraged to take multivitamin, vitamin d, vitamin c and zinc to increase immune system. Aware can call office if would like to have vaccine here at office. Verbalized acceptance and understanding.    Class 2 obesity due to excess calories with body mass index (BMI) of 37.0 to 37.9 in adult, unspecified whether serious comorbidity present Assessment & Plan: She is encouraged to strive for BMI less than 30 to decrease cardiac risk. Advised to aim for at least  150 minutes of exercise per week. She was denied [place] and will call her insurance company to see what is covered.     Return for schedule 8 week f/u new medication.  Patient was given opportunity to ask questions. Patient verbalized understanding of the plan and was able to repeat key elements of the plan. All questions were answered to their satisfaction.    Jeanell Sparrow, FNP, have reviewed all documentation for this visit. The documentation on 02/04/24 for the exam, diagnosis, procedures, and orders are all accurate and complete.   IF YOU HAVE BEEN REFERRED TO A SPECIALIST, IT MAY TAKE 1-2 WEEKS TO SCHEDULE/PROCESS THE REFERRAL. IF YOU HAVE NOT HEARD FROM US/SPECIALIST IN TWO WEEKS,  PLEASE GIVE [place] A CALL AT [phone].

## 2024-02-05 ENCOUNTER — Encounter: Payer: Self-pay | Admitting: Nurse Practitioner

## 2024-02-05 ENCOUNTER — Ambulatory Visit: Payer: Self-pay | Admitting: Nurse Practitioner

## 2024-02-05 NOTE — Telephone Encounter (Signed)
 Please schedule her for an appt.

## 2024-03-03 ENCOUNTER — Encounter: Payer: Self-pay | Admitting: Nurse Practitioner

## 2024-03-04 ENCOUNTER — Other Ambulatory Visit: Payer: Self-pay | Admitting: Nurse Practitioner

## 2024-03-04 ENCOUNTER — Encounter: Payer: Self-pay | Admitting: Nurse Practitioner

## 2024-03-04 ENCOUNTER — Other Ambulatory Visit: Payer: Self-pay

## 2024-03-04 DIAGNOSIS — E66812 Obesity, class 2: Secondary | ICD-10-CM | POA: Diagnosis not present

## 2024-03-04 DIAGNOSIS — E6609 Other obesity due to excess calories: Secondary | ICD-10-CM

## 2024-03-04 DIAGNOSIS — Z6837 Body mass index (BMI) 37.0-37.9, adult: Secondary | ICD-10-CM

## 2024-03-04 LAB — POCT URINE PREGNANCY: Preg Test, Ur: NEGATIVE

## 2024-03-04 MED ORDER — PHENTERMINE HCL 15 MG PO CAPS
15.0000 mg | ORAL_CAPSULE | ORAL | 1 refills | Status: DC
Start: 2024-03-04 — End: 2024-04-16

## 2024-03-10 ENCOUNTER — Ambulatory Visit: Payer: BC Managed Care – PPO | Admitting: Nurse Practitioner

## 2024-04-01 ENCOUNTER — Ambulatory Visit: Admitting: Nurse Practitioner

## 2024-04-15 NOTE — Progress Notes (Unsigned)
 [place], CMA,acting as a Neurosurgeon for Susanna Epley, FNP.,have documented all relevant documentation on the behalf of Susanna Epley, FNP,as directed by  Susanna Epley, FNP while in the presence of Susanna Epley, FNP.  Subjective:  Patient ID: Mia Ortiz , female    DOB: [birth year] , 33 y.o.   MRN: [id number]  Chief Complaint  Patient presents with   Obesity    Patient presents today for a weight follow up, Patient reports compliance with medication. Patient denies any chest pain, SOB, or headaches.    Vaginitis    Patient reports she is getting yeast infections every month after having her cycle. She reports she notices it more during her fertility window. She is sexually active with no protection. She reports a lot of vaginal itching during that time, she reports not much discharge. She reports she uses OTC medications but it always comes back.    HPI  Taking medications as prescribed.  Feels that the phentermine  has been working, decreased appetite.  She has not had the medication for about a week, and since then has felt palpitations.    Diet has improved, limits fast food intake, decreased consumption of breads, sugars and pastas. She is drinking more water, up to 80 oz per day.  She is working on increasing her activity, moving more at work, but no scheduled exercise.    Endorses yeast infections after her cycle monthly for the past 3 months.  She is  using monastat complete at night, this helps but never complete relieves her symptoms. New sexual partner as of 3 months ago, no latex condom use, no change in contraception.  On her period tracking app this occurs around ovulation time.  Small amount of discharge, white/yellow in color, no odor.  Increased itching, but no pain or bleeding with yeast infection.    Sees OB/GYN for pap exams       Past Medical History:  Diagnosis Date   Leukopenia 10/14/2013   Pre-diabetes      Family History  Problem Relation Age of Onset    Hyperlipidemia Mother    Cancer Paternal Uncle        lung cancer   Cancer Maternal Grandmother        cervical cancer   Cancer Maternal Grandfather        stomach ca     Current Outpatient Medications:    albuterol  (VENTOLIN  HFA) 108 (90 Base) MCG/ACT inhaler, Inhale 2 puffs into the lungs every 6 (six) hours as needed for wheezing or shortness of breath., Disp: 8 g, Rfl: 2   diphenhydrAMINE  (BENADRYL ) 25 MG tablet, Take 25 mg by mouth at bedtime., Disp: , Rfl:    escitalopram (LEXAPRO) 10 MG tablet, Take 20 mg by mouth daily., Disp: , Rfl:    etonogestrel-ethinyl estradiol (NUVARING) 0.12-0.015 MG/24HR vaginal ring, Place 1 each vaginally every 28 (twenty-eight) days., Disp: , Rfl:    fluconazole (DIFLUCAN) 100 MG tablet, Take 1 tablet (100 mg total) by mouth daily. Take one tablet by mouth now then repeat in 5 days, Disp: 2 tablet, Rfl: 0   gabapentin (NEURONTIN) 300 MG capsule, Take 300 mg by mouth at bedtime., Disp: , Rfl:    ibuprofen  (ADVIL ) 600 MG tablet, Take 1 tablet (600 mg total) by mouth every 6 (six) hours as needed., Disp: 30 tablet, Rfl: 0   metroNIDAZOLE (FLAGYL) 500 MG tablet, Take 1 tablet (500 mg total) by mouth 3 (three) times daily for 10 days., Disp: 30  tablet, Rfl: 0   phentermine  37.5 MG capsule, Take 1 capsule (37.5 mg total) by mouth every morning., Disp: 30 capsule, Rfl: 1   SUMAtriptan  (IMITREX ) 50 MG tablet, Take 1 tablet (50 mg total) by mouth once as needed for migraine. May repeat in 2 hours if headache persists or recurs., Disp: 30 tablet, Rfl: 2   fluticasone  (FLONASE ) 50 MCG/ACT nasal spray, Place 1 spray into both nostrils 2 (two) times daily. (Patient not taking: Reported on 01/01/2024), Disp: 16 g, Rfl: 2   Allergies  Allergen Reactions   Famotidine  Other (See Comments)    Headache     Review of Systems  Constitutional:  Negative for chills, fatigue and fever.  HENT:  Negative for congestion.   Respiratory:  Negative for cough, chest tightness  and shortness of breath.   Cardiovascular:  Negative for chest pain and palpitations.  Gastrointestinal:  Negative for abdominal pain.  Endocrine: Negative for polydipsia, polyphagia and polyuria.  Genitourinary:  Positive for vaginal discharge.  Neurological:  Negative for dizziness and headaches.     Today's Vitals   04/16/24 1559  BP: 100/64  Pulse: 96  Temp: 98.8 F (37.1 C)  TempSrc: Oral  Weight: 224 lb 3.2 oz (101.7 kg)  Height: 5\' 5"  (1.651 m)  PainSc: 0-No pain   Body mass index is 37.31 kg/m.  Wt Readings from Last 3 Encounters:  04/16/24 224 lb 3.2 oz (101.7 kg)  02/04/24 227 lb 12.8 oz (103.3 kg)  01/01/24 227 lb (103 kg)      Objective:  Physical Exam Vitals reviewed. Exam conducted with a chaperone present.  Constitutional:      General: She is not in acute distress.    Appearance: Normal appearance. She is normal weight.  HENT:     Head: Normocephalic and atraumatic.     Right Ear: External ear normal.     Left Ear: External ear normal.  Eyes:     Pupils: Pupils are equal, round, and reactive to light.  Cardiovascular:     Rate and Rhythm: Normal rate and regular rhythm.     Pulses: Normal pulses.     Heart sounds: Normal heart sounds.  Pulmonary:     Effort: Pulmonary effort is normal.     Breath sounds: Normal breath sounds.  Genitourinary:    General: Normal vulva.     Exam position: Lithotomy position.     Pubic Area: No rash.      Tanner stage (genital): 5.     Labia:        Right: No rash, tenderness or lesion.        Left: No rash, tenderness or lesion.      Urethra: No urethral swelling or urethral lesion.     Vagina: Vaginal discharge (white, thin) present.     Cervix: Friability and erythema present.     Rectum: Normal.  Skin:    General: Skin is warm and dry.     Capillary Refill: Capillary refill takes less than 2 seconds.     Findings: No rash.  Neurological:     General: No focal deficit present.     Mental Status: She is  alert and oriented to person, place, and time. Mental status is at baseline.  Psychiatric:        Mood and Affect: Mood normal.        Behavior: Behavior normal.        Thought Content: Thought content normal.  Judgment: Judgment normal.         Assessment And Plan:  Vaginal discharge Assessment & Plan: Pelvic exam perfomed, flagyl and diflucan prescribed  Orders: -     NuSwab Vaginitis Plus (VG+) -     metroNIDAZOLE; Take 1 tablet (500 mg total) by mouth 3 (three) times daily for 10 days.  Dispense: 30 tablet; Refill: 0 -     Fluconazole; Take 1 tablet (100 mg total) by mouth daily. Take one tablet by mouth now then repeat in 5 days  Dispense: 2 tablet; Refill: 0  COVID-19 vaccination declined  Class 2 obesity due to excess calories with body mass index (BMI) of 37.0 to 37.9 in adult, unspecified whether serious comorbidity present Assessment & Plan: Phentermine  increased to 37.5 mg, weight is trending down, encourage increased physical activity 150 minutes per week, and to follow a low carbohydrate, low fat, high protein diet.   Orders: -     Phentermine  HCl; Take 1 capsule (37.5 mg total) by mouth every morning.  Dispense: 30 capsule; Refill: 1    Return for keep same next.  Patient was given opportunity to ask questions. Patient verbalized understanding of the plan and was able to repeat key elements of the plan. All questions were answered to their satisfaction.   I have reviewed this encounter including the documentation in this note and/or discussed this patient with Mickael Alamo FNP Student. I am certifying that I agree with the content of this note as the primary care nurse practitioner.  Susanna Epley, DNP, FNP-BC  I, Susanna Epley, FNP, have reviewed all documentation for this visit. The documentation on 04/16/24 for the exam, diagnosis, procedures, and orders are all accurate and complete.   IF YOU HAVE BEEN REFERRED TO A SPECIALIST, IT MAY TAKE 1-2 WEEKS  TO SCHEDULE/PROCESS THE REFERRAL. IF YOU HAVE NOT HEARD FROM US /SPECIALIST IN TWO WEEKS, PLEASE GIVE US  A CALL AT [phone].

## 2024-04-16 ENCOUNTER — Ambulatory Visit: Admitting: Nurse Practitioner

## 2024-04-16 ENCOUNTER — Encounter: Payer: Self-pay | Admitting: Nurse Practitioner

## 2024-04-16 VITALS — BP 100/64 | HR 96 | Temp 98.8°F | Ht 65.0 in | Wt 224.2 lb

## 2024-04-16 DIAGNOSIS — Z2821 Immunization not carried out because of patient refusal: Secondary | ICD-10-CM

## 2024-04-16 DIAGNOSIS — N898 Other specified noninflammatory disorders of vagina: Secondary | ICD-10-CM

## 2024-04-16 DIAGNOSIS — E66812 Obesity, class 2: Secondary | ICD-10-CM | POA: Diagnosis not present

## 2024-04-16 DIAGNOSIS — Z6837 Body mass index (BMI) 37.0-37.9, adult: Secondary | ICD-10-CM

## 2024-04-16 DIAGNOSIS — E6609 Other obesity due to excess calories: Secondary | ICD-10-CM | POA: Diagnosis not present

## 2024-04-16 MED ORDER — METRONIDAZOLE 500 MG PO TABS
500.0000 mg | ORAL_TABLET | Freq: Three times a day (TID) | ORAL | 0 refills | Status: AC
Start: 2024-04-16 — End: 2024-04-26

## 2024-04-16 MED ORDER — FLUCONAZOLE 100 MG PO TABS: 100.0000 mg | ORAL_TABLET | Freq: Every day | ORAL | 0 refills | Status: AC

## 2024-04-16 MED ORDER — PHENTERMINE HCL 37.5 MG PO CAPS
37.5000 mg | ORAL_CAPSULE | ORAL | 1 refills | Status: DC
Start: 2024-04-16 — End: 2024-06-15

## 2024-04-16 NOTE — Assessment & Plan Note (Signed)
 Phentermine  increased to 37.5 mg, weight is trending down, encourage increased physical activity 150 minutes per week, and to follow a low carbohydrate, low fat, high protein diet.

## 2024-04-16 NOTE — Assessment & Plan Note (Signed)
 Pelvic exam perfomed, flagyl and diflucan prescribed

## 2024-04-19 LAB — NUSWAB VAGINITIS PLUS (VG+)
Candida albicans, NAA: NEGATIVE
Candida glabrata, NAA: NEGATIVE
Chlamydia trachomatis, NAA: NEGATIVE
Megasphaera 1: HIGH {score} — AB
Neisseria gonorrhoeae, NAA: NEGATIVE
Trich vag by NAA: NEGATIVE

## 2024-04-21 ENCOUNTER — Ambulatory Visit: Payer: Self-pay | Admitting: Nurse Practitioner

## 2024-04-23 ENCOUNTER — Encounter: Payer: Self-pay | Admitting: Nurse Practitioner

## 2024-05-18 ENCOUNTER — Ambulatory Visit: Payer: Self-pay

## 2024-05-18 NOTE — Telephone Encounter (Signed)
 Copied from CRM [id number]. Topic: Clinical - Red Word Triage >> May 18, 2024  1:14 PM Powell HERO wrote: Red Word that prompted transfer to Nurse Triage: Patient was taking meds for a yeast infection finished the meds and it seems to have worsened and she says this time there is blood as well. Reason for Disposition  [1] Symptoms of a yeast infection (i.e., itchy, white discharge, not bad smelling) AND [2] not improved > 3 days following Care Advice  Answer Assessment - Initial Assessment Questions 1. SYMPTOM: What's the main symptom you're concerned about? (e.g., pain, itching, dryness)     Itchiness  3. ONSET: When did the itchiness start?     2 days  4. PAIN: Is there any pain? If Yes, ask: How bad is it? (Scale: 1-10; mild, moderate, severe)   -  MILD (1-3): Doesn't interfere with normal activities.    -  MODERATE (4-7): Interferes with normal activities (e.g., work or school) or awakens from sleep.     -  SEVERE (8-10): Excruciating pain, unable to do any normal activities.     Mild  5. ITCHING: Is there any itching? If Yes, ask: How bad is it? (Scale: 1-10; mild, moderate, severe)     Mild to moderate  6. CAUSE: What do you think is causing the discharge? Have you had the same problem before? What happened then?     Yeast infection  7. OTHER SYMPTOMS: Do you have any other symptoms? (e.g., fever, itching, vaginal bleeding, pain with urination, injury to genital area, vaginal foreign body)     Some mild bleeding  8. PREGNANCY: Is there any chance you are pregnant? When was your last menstrual period?     No  Protocols used: Vaginal Symptoms-A-AH   FYI Only or Action Required?: FYI only for provider.  Patient was last seen in primary care on 04/16/2024 by Georgina Speaks, FNP. Called Nurse Triage reporting Vaginitis. Symptoms began 3 days ago. Symptoms are: gradually worsening.  Triage Disposition: See PCP When Office is Open (Within 3 Days)  Patient/caregiver  understands and will follow disposition?: Yes

## 2024-05-19 ENCOUNTER — Encounter: Payer: Self-pay | Admitting: Internal Medicine

## 2024-05-19 ENCOUNTER — Ambulatory Visit: Payer: Self-pay | Admitting: Internal Medicine

## 2024-05-19 VITALS — BP 110/70 | HR 86 | Temp 98.1°F | Ht 65.0 in | Wt 216.0 lb

## 2024-05-19 DIAGNOSIS — R300 Dysuria: Secondary | ICD-10-CM | POA: Diagnosis not present

## 2024-05-19 DIAGNOSIS — N898 Other specified noninflammatory disorders of vagina: Secondary | ICD-10-CM

## 2024-05-19 DIAGNOSIS — E6609 Other obesity due to excess calories: Secondary | ICD-10-CM

## 2024-05-19 MED ORDER — FLUCONAZOLE 150 MG PO TABS: ORAL_TABLET | ORAL | 0 refills | Status: DC

## 2024-05-19 NOTE — Progress Notes (Signed)
 I,[name], CMA,acting as a Neurosurgeon for Mia LOISE Slocumb, MD.,have documented all relevant documentation on the behalf of Mia LOISE Slocumb, MD,as directed by  Mia LOISE Slocumb, MD while in the presence of Mia LOISE Slocumb, MD.  Subjective:  Patient ID: Mia Ortiz , female    DOB: Feb 23, 1991 , 33 y.o.   MRN: [id number]  Chief Complaint  Patient presents with   Vaginal Itching    Patient presents today for evaluation of recurrent vaginal itching. Patient reports she used a new body wash on Saturday and after that she noticed some irritation.  She has also noticed burning after urinating and she has noticed some spotting.     HPI Discussed the use of AI scribe software for clinical note transcription with the patient, who gave verbal consent to proceed.  History of Present Illness Mia Ortiz is a 33 year old female who presents with recurrent symptoms of a yeast infection.  She experiences vaginal itching, discharge, and a burning sensation, initially occurring over a month ago. These symptoms were treated with Diflucan  and another unspecified medication. The symptoms have recurred, starting on Saturday, with a burning sensation being more prominent this time.  She is sexually active and has been more active with her boyfriend recently. She suspects that changes in her or her partner's pH levels might be contributing to the recurrence of symptoms, as they seem to occur around the same time each month. No cottage cheese-like discharge is observed. She rarely wears underwear, opting to 'go as free as possible,' especially at night. She uses the NuvaRing for birth control and ensures to urinate and clean herself after intercourse.  Her current medications include phentermine  for weight loss. She is mindful of staying hydrated, primarily drinking water and occasionally diet Sprite. She engages in physical activity by walking three times a week with her boyfriend.   Past Medical  History:  Diagnosis Date   Leukopenia 10/14/2013   Pre-diabetes      Family History  Problem Relation Age of Onset   Hyperlipidemia Mother    Cancer Paternal Uncle        lung cancer   Cancer Maternal Grandmother        cervical cancer   Cancer Maternal Grandfather        stomach ca     Current Outpatient Medications:    albuterol  (VENTOLIN  HFA) 108 (90 Base) MCG/ACT inhaler, Inhale 2 puffs into the lungs every 6 (six) hours as needed for wheezing or shortness of breath., Disp: 8 g, Rfl: 2   diphenhydrAMINE  (BENADRYL ) 25 MG tablet, Take 25 mg by mouth at bedtime., Disp: , Rfl:    escitalopram (LEXAPRO) 10 MG tablet, Take 20 mg by mouth daily., Disp: , Rfl:    etonogestrel-ethinyl estradiol (NUVARING) 0.12-0.015 MG/24HR vaginal ring, Place 1 each vaginally every 28 (twenty-eight) days., Disp: , Rfl:    fluconazole  (DIFLUCAN ) 150 MG tablet, One tab po today, repeat in 48 hours, Disp: 2 tablet, Rfl: 0   fluticasone  (FLONASE ) 50 MCG/ACT nasal spray, Place 1 spray into both nostrils 2 (two) times daily., Disp: 16 g, Rfl: 2   gabapentin (NEURONTIN) 300 MG capsule, Take 300 mg by mouth at bedtime., Disp: , Rfl:    ibuprofen  (ADVIL ) 600 MG tablet, Take 1 tablet (600 mg total) by mouth every 6 (six) hours as needed., Disp: 30 tablet, Rfl: 0   phentermine  37.5 MG capsule, Take 1 capsule (37.5 mg total) by mouth every morning., Disp: 30  capsule, Rfl: 1   SUMAtriptan  (IMITREX ) 50 MG tablet, Take 1 tablet (50 mg total) by mouth once as needed for migraine. May repeat in 2 hours if headache persists or recurs., Disp: 30 tablet, Rfl: 2   Allergies  Allergen Reactions   Famotidine  Other (See Comments)    Headache     Review of Systems  Constitutional: Negative.   Respiratory: Negative.    Cardiovascular: Negative.   Gastrointestinal: Negative.   Genitourinary:  Positive for dysuria and vaginal discharge.  Neurological: Negative.   Psychiatric/Behavioral: Negative.       Today's Vitals    05/19/24 1626  BP: 110/70  Pulse: 86  Temp: 98.1 F (36.7 C)  TempSrc: Oral  Weight: 216 lb (98 kg)  Height: 5' 5 (1.651 m)  PainSc: 0-No pain   Body mass index is 35.94 kg/m.  Wt Readings from Last 3 Encounters:  05/19/24 216 lb (98 kg)  04/16/24 224 lb 3.2 oz (101.7 kg)  02/04/24 227 lb 12.8 oz (103.3 kg)    The ASCVD Risk score (Arnett DK, et al., 2019) failed to calculate for the following reasons:   The 2019 ASCVD risk score is only valid for ages 26 to 96  Objective:  Physical Exam Vitals and nursing note reviewed. Exam conducted with a chaperone present.  Constitutional:      Appearance: Normal appearance.  HENT:     Head: Normocephalic and atraumatic.  Eyes:     Extraocular Movements: Extraocular movements intact.  Cardiovascular:     Rate and Rhythm: Normal rate and regular rhythm.     Heart sounds: Normal heart sounds.  Pulmonary:     Effort: Pulmonary effort is normal.     Breath sounds: Normal breath sounds.  Abdominal:     Hernia: There is no hernia in the left inguinal area.  Genitourinary:    General: Normal vulva.     Exam position: Supine.     Labia:        Right: No lesion.        Left: No lesion.      Vagina: Vaginal discharge present.     Adnexa:        Right: No mass.         Left: No mass.    Musculoskeletal:     Cervical back: Normal range of motion.  Lymphadenopathy:     Lower Body: No right inguinal adenopathy. No left inguinal adenopathy.  Skin:    General: Skin is warm.  Neurological:     General: No focal deficit present.     Mental Status: She is alert.  Psychiatric:        Mood and Affect: Mood normal.        Behavior: Behavior normal.         Assessment And Plan:  Vaginal itching Assessment & Plan: Recurrent discharge with itching and burning. Current symptoms not typical of candidiasis. Previous BV -  testing indeterminate. Symptoms possibly related to ovulation or partner's pH. Prefers to avoid Monistat.- -  -  Pelvic exam performed - No need to repeat Nuswab today, previous results reviewed - Prescribed fluconazole  for suspected yeast infection. - Advised follow-up with gynecology if symptoms persist. - Discussed potential partner treatment if symptoms recur. - Encouraged partner to establish care with a primary care provider.   Dysuria -     POCT URINALYSIS DIP (CLINITEK)  Other orders -     Fluconazole ; One tab po today, repeat in 48 hours  Dispense: 2 tablet; Refill: 0   Return if symptoms worsen or fail to improve.  Patient was given opportunity to ask questions. Patient verbalized understanding of the plan and was able to repeat key elements of the plan. All questions were answered to their satisfaction.    I, Mia LOISE Slocumb, MD, have reviewed all documentation for this visit. The documentation on 05/19/24 for the exam, diagnosis, procedures, and orders are all accurate and complete.   IF YOU HAVE BEEN REFERRED TO A SPECIALIST, IT MAY TAKE 1-2 WEEKS TO SCHEDULE/PROCESS THE REFERRAL. IF YOU HAVE NOT HEARD FROM US /SPECIALIST IN TWO WEEKS, PLEASE GIVE US  A CALL AT [phone] X [phone].

## 2024-05-24 DIAGNOSIS — R300 Dysuria: Secondary | ICD-10-CM | POA: Insufficient documentation

## 2024-05-24 DIAGNOSIS — N898 Other specified noninflammatory disorders of vagina: Secondary | ICD-10-CM | POA: Insufficient documentation

## 2024-05-24 NOTE — Assessment & Plan Note (Signed)
 Recurrent discharge with itching and burning. Current symptoms not typical of candidiasis. Previous BV -  testing indeterminate. Symptoms possibly related to ovulation or partner's pH. Prefers to avoid Monistat.- -  - Pelvic exam performed - No need to repeat Nuswab today, previous results reviewed - Prescribed fluconazole  for suspected yeast infection. - Advised follow-up with gynecology if symptoms persist. - Discussed potential partner treatment if symptoms recur. - Encouraged partner to establish care with a primary care provider.

## 2024-05-25 ENCOUNTER — Ambulatory Visit: Payer: Self-pay | Admitting: Internal Medicine

## 2024-05-25 LAB — POCT URINALYSIS DIP (CLINITEK)
Blood, UA: NEGATIVE
Glucose, UA: NEGATIVE mg/dL
Leukocytes, UA: NEGATIVE
Nitrite, UA: NEGATIVE
POC PROTEIN,UA: NEGATIVE
Spec Grav, UA: >=1.03 — AB (ref 1.010–1.025)
Urobilinogen, UA: 0.2 U/dL
pH, UA: 6 (ref 5.0–8.0)

## 2024-05-25 NOTE — Addendum Note (Signed)
 Addended by: CRAVEN ADELIA PARAS on: 05/25/2024 11:20 AM   Modules accepted: Orders

## 2024-06-03 ENCOUNTER — Encounter: Payer: Self-pay | Admitting: Nurse Practitioner

## 2024-06-03 ENCOUNTER — Other Ambulatory Visit: Payer: Self-pay

## 2024-06-03 MED ORDER — ESCITALOPRAM OXALATE 10 MG PO TABS: 20.0000 mg | ORAL_TABLET | Freq: Every day | ORAL | 2 refills | Status: DC

## 2024-06-04 ENCOUNTER — Encounter: Payer: Self-pay | Admitting: Nurse Practitioner

## 2024-06-09 ENCOUNTER — Encounter: Admitting: Nurse Practitioner

## 2024-06-15 ENCOUNTER — Encounter: Payer: Self-pay | Admitting: Internal Medicine

## 2024-06-15 ENCOUNTER — Ambulatory Visit: Admitting: Internal Medicine

## 2024-06-15 VITALS — BP 108/70 | HR 90 | Temp 98.7°F | Ht 65.0 in | Wt 216.8 lb

## 2024-06-15 DIAGNOSIS — E66812 Obesity, class 2: Secondary | ICD-10-CM | POA: Diagnosis not present

## 2024-06-15 DIAGNOSIS — Z6836 Body mass index (BMI) 36.0-36.9, adult: Secondary | ICD-10-CM | POA: Diagnosis not present

## 2024-06-15 DIAGNOSIS — G4709 Other insomnia: Secondary | ICD-10-CM | POA: Diagnosis not present

## 2024-06-15 DIAGNOSIS — F19982 Other psychoactive substance use, unspecified with psychoactive substance-induced sleep disorder: Secondary | ICD-10-CM | POA: Insufficient documentation

## 2024-06-15 DIAGNOSIS — E6609 Other obesity due to excess calories: Secondary | ICD-10-CM

## 2024-06-15 DIAGNOSIS — G4700 Insomnia, unspecified: Secondary | ICD-10-CM | POA: Insufficient documentation

## 2024-06-15 MED ORDER — PHENTERMINE HCL 37.5 MG PO CAPS
37.5000 mg | ORAL_CAPSULE | ORAL | 1 refills | Status: DC
Start: 2024-06-15 — End: 2024-08-17

## 2024-06-15 MED ORDER — DAYVIGO 5 MG PO TABS: ORAL_TABLET | ORAL | 1 refills | Status: AC
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
## 2024-06-15 NOTE — Patient Instructions (Signed)

## 2024-06-19 NOTE — Assessment & Plan Note (Addendum)
 Lost 8 pounds since May. On phentermine  until October. Goal weight under 200 pounds. Walking for exercise, lacks strength training. Discussed importance of strength training to prevent muscle loss. - Incorporate strength training twice a week. - Continue phentermine  until October. - Encourage use of apartment gym for exercise. - Follow up in 8-10 weeks.

## 2024-06-19 NOTE — Assessment & Plan Note (Signed)
 Increased hyperactivity and insomnia since phentermine  dosage increase. Takes medication early, but insomnia persists. Z-Quil used, not ideal. Magnesium glycinate somewhat helpful. Discussed phentermine 's insomnia risk. Considered sleep medication, pending insurance approval. Advised on hydration and caffeine intake. - Continue magnesium glycinate for overall health. - Prescribe sleep medication, pending insurance approval. - Advise against regular use of Z-Quil. - Ensure adequate hydration while on phentermine . - Limit caffeine intake to one cup of coffee per day.

## 2024-08-02 ENCOUNTER — Encounter: Payer: Self-pay | Admitting: Emergency Medicine

## 2024-08-02 ENCOUNTER — Ambulatory Visit
Admission: EM | Admit: 2024-08-02 | Discharge: 2024-08-02 | Disposition: A | Discharge status: Home / Self Care | Attending: Emergency Medicine | Admitting: Emergency Medicine

## 2024-08-02 DIAGNOSIS — J069 Acute upper respiratory infection, unspecified: Secondary | ICD-10-CM | POA: Insufficient documentation

## 2024-08-02 LAB — GROUP A STREP BY PCR: Group A Strep by PCR: NOT DETECTED

## 2024-08-02 LAB — SARS CORONAVIRUS 2 BY RT PCR: SARS Coronavirus 2 by RT PCR: NEGATIVE

## 2024-08-02 MED ORDER — BENZONATATE 100 MG PO CAPS: 200.0000 mg | ORAL_CAPSULE | Freq: Three times a day (TID) | ORAL | 0 refills | Status: DC

## 2024-08-02 MED ORDER — ALBUTEROL SULFATE HFA 108 (90 BASE) MCG/ACT IN AERS: 2.0000 | INHALATION_SPRAY | Freq: Four times a day (QID) | RESPIRATORY_TRACT | 2 refills | Status: AC | PRN

## 2024-08-02 MED ORDER — IPRATROPIUM BROMIDE 0.06 % NA SOLN: 2.0000 | Freq: Four times a day (QID) | NASAL | 12 refills | Status: AC

## 2024-08-02 MED ORDER — PROMETHAZINE-DM 6.25-15 MG/5ML PO SYRP: 5.0000 mL | ORAL_SOLUTION | Freq: Four times a day (QID) | ORAL | 0 refills | Status: DC | PRN

## 2024-08-02 NOTE — ED Triage Notes (Signed)
 Patient c/o sore throat, cough, headache, chills and bodyaches that started yesterday.  Patient unsure of fevers.

## 2024-08-02 NOTE — Discharge Instructions (Signed)
 Your testing today was negative for strep and COVID.  I do believe you have a respiratory virus which is causing your symptoms.  Use the albuterol  inhaler every 4-6 hours as needed for any shortness of breath or wheezing.  You may take 1 to 2 puffs at a time.  Use over-the-counter Tylenol  and or ibuprofen  according to the package instructions as needed for any pain or fever.  Use the Atrovent  nasal spray, 2 squirts in each nostril every 6 hours, as needed for runny nose and postnasal drip.  Use the Tessalon  Perles every 8 hours during the day.  Take them with a small sip of water.  They may give you some numbness to the base of your tongue or a metallic taste in your mouth, this is normal.  Use the Promethazine  DM cough syrup at bedtime for cough and congestion.  It will make you drowsy so do not take it during the day.  Return for reevaluation or see your primary care provider for any new or worsening symptoms.

## 2024-08-02 NOTE — ED Provider Notes (Signed)
 [facility]    CSN: [id number] Arrival date & time: 08/02/24  1455      History   Chief Complaint Chief Complaint  Patient presents with   Cough   Sore Throat    HPI Mia Ortiz is a 33 y.o. female.   HPI  33 year old female with past medical history significant for prediabetes and insomnia presents for evaluation of flulike symptoms that started yesterday.  These include headache, body aches, chills, sore throat, and a cough.  She also Dors is runny nose, nasal congestion, sneezing, chest tightness, and fatigue.  No fever, known sick contacts, or recent travel.  Past Medical History:  Diagnosis Date   Leukopenia 10/14/2013   Pre-diabetes     Patient Active Problem List   Diagnosis Date Noted   Insomnia 06/15/2024   Vaginal itching 05/24/2024   Dysuria 05/24/2024   Vaginal discharge 04/16/2024   Acute nonintractable headache 02/04/2024   Class 2 severe obesity due to excess calories with serious comorbidity and body mass index (BMI) of 36.0 to 36.9 in adult [facility]) 02/04/2024   Establishing care with new doctor, encounter for 01/12/2024   Influenza vaccination declined 01/12/2024   COVID-19 vaccination declined 01/12/2024   Prediabetes 01/12/2024   Class 1 obesity due to excess calories without serious comorbidity with body mass index (BMI) of 33.0 to 33.9 in adult 01/12/2024   Seasonal allergic conjunctivitis 08/04/2023   Seasonal and perennial allergic rhinitis 06/13/2021   Allergic conjunctivitis of both eyes 06/13/2021   Leukopenia 10/14/2013    Past Surgical History:  Procedure Laterality Date   CHOLECYSTECTOMY N/A 01/27/2022   Procedure: LAPAROSCOPIC CHOLECYSTECTOMY;  Surgeon: Lyndel Deward PARAS, MD;  Location: [facility] OR;  Service: General;  Laterality: N/A;    OB History   No obstetric history on file.      Home Medications    Prior to Admission medications   Medication Sig Start Date End Date Taking? Authorizing Provider   benzonatate  (TESSALON ) 100 MG capsule Take 2 capsules (200 mg total) by mouth every 8 (eight) hours. 08/02/24  Yes Bernardino Ditch, NP  ipratropium (ATROVENT ) 0.06 % nasal spray Place 2 sprays into both nostrils 4 (four) times daily. 08/02/24  Yes Bernardino Ditch, NP  promethazine -dextromethorphan (PROMETHAZINE -DM) 6.25-15 MG/5ML syrup Take 5 mLs by mouth 4 (four) times daily as needed. 08/02/24  Yes Bernardino Ditch, NP  albuterol  (VENTOLIN  HFA) 108 (90 Base) MCG/ACT inhaler Inhale 2 puffs into the lungs every 6 (six) hours as needed for wheezing or shortness of breath. 08/02/24   Bernardino Ditch, NP  diphenhydrAMINE  (BENADRYL ) 25 MG tablet Take 25 mg by mouth at bedtime.    [provider]  escitalopram  (LEXAPRO ) 10 MG tablet Take 2 tablets (20 mg total) by mouth daily. 06/03/24   Georgina Speaks, FNP  etonogestrel-ethinyl estradiol (NUVARING) 0.12-0.015 MG/24HR vaginal ring Place 1 each vaginally every 28 (twenty-eight) days.    [provider]  fluticasone  (FLONASE ) 50 MCG/ACT nasal spray Place 1 spray into both nostrils 2 (two) times daily. 12/25/23   Stuart Vernell Norris, PA-C  ibuprofen  (ADVIL ) 600 MG tablet Take 1 tablet (600 mg total) by mouth every 6 (six) hours as needed. 07/09/23   Silver Wonda LABOR, PA  Lemborexant  (DAYVIGO ) 5 MG TABS One tab po qd 06/15/24   Jarold Medici, MD  phentermine  37.5 MG capsule Take 1 capsule (37.5 mg total) by mouth every morning. 06/15/24   Jarold Medici, MD  SUMAtriptan  (IMITREX ) 50 MG tablet Take 1 tablet (50 mg  total) by mouth once as needed for migraine. May repeat in 2 hours if headache persists or recurs. 02/04/24 02/04/25  Georgina Speaks, FNP    Family History Family History  Problem Relation Age of Onset   Hyperlipidemia Mother    Cancer Paternal Uncle        lung cancer   Cancer Maternal Grandmother        cervical cancer   Cancer Maternal Grandfather        stomach ca    Social History Social History   Tobacco Use   Smoking status: Former     Current packs/day: 0.50    Average packs/day: 0.5 packs/day for 3.0 years (1.5 ttl pk-yrs)    Types: Cigarettes   Smokeless tobacco: Never  Vaping Use   Vaping status: Former  Substance Use Topics   Alcohol use: No   Drug use: No     Allergies   Famotidine    Review of Systems Review of Systems  Constitutional:  Negative for fever.  HENT:  Positive for congestion, ear pain, rhinorrhea, sneezing and sore throat.   Respiratory:  Positive for cough. Negative for shortness of breath and wheezing.      Physical Exam Triage Vital Signs ED Triage Vitals  Encounter Vitals Group     BP 08/02/24 1503 118/84     Girls Systolic BP Percentile --      Girls Diastolic BP Percentile --      Boys Systolic BP Percentile --      Boys Diastolic BP Percentile --      Pulse Rate 08/02/24 1503 89     Resp 08/02/24 1503 14     Temp 08/02/24 1503 98.8 F (37.1 C)     Temp Source 08/02/24 1503 Oral     SpO2 08/02/24 1503 95 %     Weight 08/02/24 1501 216 lb 11.4 oz (98.3 kg)     Height 08/02/24 1501 5' 5 (1.651 m)     Head Circumference --      Peak Flow --      Pain Score 08/02/24 1501 8     Pain Loc --      Pain Education --      Exclude from Growth Chart --    No data found.  Updated Vital Signs BP 118/84 (BP Location: Right Arm)   Pulse 89   Temp 98.8 F (37.1 C) (Oral)   Resp 14   Ht 5' 5 (1.651 m)   Wt 216 lb 11.4 oz (98.3 kg)   LMP 07/12/2024 (Approximate)   SpO2 95%   BMI 36.06 kg/m   Visual Acuity Right Eye Distance:   Left Eye Distance:   Bilateral Distance:    Right Eye Near:   Left Eye Near:    Bilateral Near:     Physical Exam Vitals and nursing note reviewed.  Constitutional:      Appearance: Normal appearance. She is ill-appearing.  HENT:     Head: Normocephalic and atraumatic.     Right Ear: Tympanic membrane, ear canal and external ear normal. There is no impacted cerumen.     Left Ear: Tympanic membrane, ear canal and external ear normal. There  is no impacted cerumen.     Nose: Congestion and rhinorrhea present.     Comments: Patient mucosa is edematous and erythematous with clear discharge in both nares.    Mouth/Throat:     Mouth: Mucous membranes are moist.     Pharynx: Oropharynx is clear.  Posterior oropharyngeal erythema present. No oropharyngeal exudate.     Comments: Erythema to the soft palate and bilateral tonsillar pillars.  No appreciable exudate. Neck:     Comments: Bilateral anterior, nontender cervical lymphadenopathy. Cardiovascular:     Rate and Rhythm: Normal rate and regular rhythm.     Pulses: Normal pulses.     Heart sounds: Normal heart sounds. No murmur heard.    No friction rub. No gallop.  Pulmonary:     Effort: Pulmonary effort is normal.     Breath sounds: Normal breath sounds. No wheezing, rhonchi or rales.  Musculoskeletal:     Cervical back: Normal range of motion and neck supple. No tenderness.  Lymphadenopathy:     Cervical: Cervical adenopathy present.  Skin:    General: Skin is warm and dry.     Capillary Refill: Capillary refill takes less than 2 seconds.     Findings: No erythema or rash.  Neurological:     General: No focal deficit present.     Mental Status: She is alert and oriented to person, place, and time.      UC Treatments / Results  Labs (all labs ordered are listed, but only abnormal results are displayed) Labs Reviewed  GROUP A STREP BY PCR  SARS CORONAVIRUS 2 BY RT PCR    EKG   Radiology No results found.  Procedures Procedures (including critical care time)  Medications Ordered in UC Medications - No data to display  Initial Impression / Assessment and Plan / UC Course  I have reviewed the triage vital signs and the nursing notes.  Pertinent labs & imaging results that were available during my care of the patient were reviewed by me and considered in my medical decision making (see chart for details).   Patient is a pleasant, though mildly  ill-appearing, 33 year old female presenting for evaluation of COVID/flulike symptoms that began yesterday.  In the triage noted mentions that she is experiencing a cough though the patient reports that she is not experiencing much of a cough right now.  Her primary complaint is headaches, body aches, chills, fatigue, and nasal pressure.  She is able to speak in full sentences without dyspnea or tachypnea.  Oxygen saturation on room air was 95% and her respiratory rate at triage was 14.  She is afebrile with a temp of 98.8.  Differential diagnoses include COVID, influenza, strep pharyngitis, viral respiratory illness.  I will order a COVID and flu PCR as well as a strep PCR.  Strep PCR is negative.  COVID PCR is negative.  I will discharge patient home with a diagnosis of viral URI with a cough.  I will prescribe Atrovent  nasal spray to help with nasal congestion.  Tessalon  Perles and Promethazine  DM cough syrup for cough and congestion.  She is prescribed an albuterol  inhaler and I will encourage her to use that as needed for any shortness of breath or wheezing.  I have sent a refill to the pharmacy.  Return precautions reviewed.  Work note provided.   Final Clinical Impressions(s) / UC Diagnoses   Final diagnoses:  Viral URI with cough     Discharge Instructions      Your testing today was negative for strep and COVID.  I do believe you have a respiratory virus which is causing your symptoms.  Use the albuterol  inhaler every 4-6 hours as needed for any shortness of breath or wheezing.  You may take 1 to 2 puffs at a time.  Use over-the-counter Tylenol  and or ibuprofen  according to the package instructions as needed for any pain or fever.  Use the Atrovent  nasal spray, 2 squirts in each nostril every 6 hours, as needed for runny nose and postnasal drip.  Use the Tessalon  Perles every 8 hours during the day.  Take them with a small sip of water.  They may give you some numbness to the base  of your tongue or a metallic taste in your mouth, this is normal.  Use the Promethazine  DM cough syrup at bedtime for cough and congestion.  It will make you drowsy so do not take it during the day.  Return for reevaluation or see your primary care provider for any new or worsening symptoms.      ED Prescriptions     Medication Sig Dispense Auth. Provider   albuterol  (VENTOLIN  HFA) 108 (90 Base) MCG/ACT inhaler Inhale 2 puffs into the lungs every 6 (six) hours as needed for wheezing or shortness of breath. 8 g Bernardino Ditch, NP   benzonatate  (TESSALON ) 100 MG capsule Take 2 capsules (200 mg total) by mouth every 8 (eight) hours. 21 capsule Bernardino Ditch, NP   ipratropium (ATROVENT ) 0.06 % nasal spray Place 2 sprays into both nostrils 4 (four) times daily. 15 mL Bernardino Ditch, NP   promethazine -dextromethorphan (PROMETHAZINE -DM) 6.25-15 MG/5ML syrup Take 5 mLs by mouth 4 (four) times daily as needed. 118 mL Bernardino Ditch, NP      PDMP not reviewed this encounter.   Bernardino Ditch, NP 08/02/24 [phone]

## 2024-08-17 ENCOUNTER — Encounter: Payer: Self-pay | Admitting: Nurse Practitioner

## 2024-08-17 ENCOUNTER — Ambulatory Visit: Admitting: Nurse Practitioner

## 2024-08-17 VITALS — BP 110/60 | HR 90 | Temp 98.5°F | Ht 65.0 in | Wt 217.0 lb

## 2024-08-17 DIAGNOSIS — E66812 Obesity, class 2: Secondary | ICD-10-CM

## 2024-08-17 DIAGNOSIS — F419 Anxiety disorder, unspecified: Secondary | ICD-10-CM | POA: Diagnosis not present

## 2024-08-17 DIAGNOSIS — F32A Depression, unspecified: Secondary | ICD-10-CM | POA: Diagnosis not present

## 2024-08-17 DIAGNOSIS — Z6836 Body mass index (BMI) 36.0-36.9, adult: Secondary | ICD-10-CM

## 2024-08-17 DIAGNOSIS — Z2821 Immunization not carried out because of patient refusal: Secondary | ICD-10-CM

## 2024-08-17 MED ORDER — HYDROXYZINE HCL 10 MG PO TABS: 10.0000 mg | ORAL_TABLET | Freq: Three times a day (TID) | ORAL | 1 refills | Status: DC | PRN

## 2024-08-17 MED ORDER — PHENTERMINE HCL 37.5 MG PO CAPS: 37.5000 mg | ORAL_CAPSULE | ORAL | 1 refills | Status: DC

## 2024-08-17 NOTE — Progress Notes (Signed)
 LILLETTE Kristeen JINNY Gladis, CMA,acting as a Neurosurgeon for [name], FNP.,have documented all relevant documentation on the behalf of [name], FNP,as directed by  [name], FNP while in the presence of [name], FNP.  Subjective:  Patient ID: Mia Ortiz , female    DOB: 1991/05/07 , 33 y.o.   MRN: [id number]  Chief Complaint  Patient presents with   Obesity    Patient presents today for a weight follow up, Patient reports compliance with medication. Patient denies any chest pain, SOB, or headaches. Patient has no concerns today.    Anxiety    Patient reports her anxiety is progressively getting worse, she reports this weekend was really bad. She reports she has a great deal of family drama.    Discussed the use of AI scribe software for clinical note transcription with the patient, who gave verbal consent to proceed.  History of Present Illness Mia Ortiz is a 33 year old female who presents for a weight check and management of anxiety and depression.  She has not taken her phentermine  37.5 mg in a few days due to prescription refill issues. She is focusing on her diet, increasing water intake, and avoiding fast food. She engages in physical activity by walking every other night for 45 minutes but has not used the gym at her apartment due to anxiety and depression.  Her anxiety and depression have been worsening, with her depression score increasing from zero to nineteen and her anxiety score at eighteen. She is taking escitalopram , 20 mg daily, but uses two 10 mg tablets due to pharmacy availability issues. She experiences difficulty sleeping, exacerbated by anxiety and depression, leading to nightmares and panic upon waking. She uses Z-Quil nightly to aid sleep. She has a therapy session scheduled for this Friday.  No heart palpitations.   Past Medical History:  Diagnosis Date   Leukopenia 10/14/2013   Pre-diabetes      Family History  Problem Relation Age of Onset    Hyperlipidemia Mother    Cancer Paternal Uncle        lung cancer   Cancer Maternal Grandmother        cervical cancer   Cancer Maternal Grandfather        stomach ca     Current Outpatient Medications:    albuterol  (VENTOLIN  HFA) 108 (90 Base) MCG/ACT inhaler, Inhale 2 puffs into the lungs every 6 (six) hours as needed for wheezing or shortness of breath., Disp: 8 g, Rfl: 2   benzonatate  (TESSALON ) 100 MG capsule, Take 2 capsules (200 mg total) by mouth every 8 (eight) hours., Disp: 21 capsule, Rfl: 0   diphenhydrAMINE  (BENADRYL ) 25 MG tablet, Take 25 mg by mouth at bedtime., Disp: , Rfl:    escitalopram  (LEXAPRO ) 10 MG tablet, Take 2 tablets (20 mg total) by mouth daily., Disp: 60 tablet, Rfl: 2   etonogestrel-ethinyl estradiol (NUVARING) 0.12-0.015 MG/24HR vaginal ring, Place 1 each vaginally every 28 (twenty-eight) days., Disp: , Rfl:    fluticasone  (FLONASE ) 50 MCG/ACT nasal spray, Place 1 spray into both nostrils 2 (two) times daily., Disp: 16 g, Rfl: 2   hydrOXYzine  (ATARAX ) 10 MG tablet, Take 1 tablet (10 mg total) by mouth every 8 (eight) hours as needed. May take 2 tabs by mouth at bedtime for sleep, Disp: 30 tablet, Rfl: 1   ibuprofen  (ADVIL ) 600 MG tablet, Take 1 tablet (600 mg total) by mouth every 6 (six) hours as needed., Disp: 30 tablet, Rfl: 0  ipratropium (ATROVENT ) 0.06 % nasal spray, Place 2 sprays into both nostrils 4 (four) times daily., Disp: 15 mL, Rfl: 12   Lemborexant  (DAYVIGO ) 5 MG TABS, One tab po qd, Disp: 30 tablet, Rfl: 1   promethazine -dextromethorphan (PROMETHAZINE -DM) 6.25-15 MG/5ML syrup, Take 5 mLs by mouth 4 (four) times daily as needed., Disp: 118 mL, Rfl: 0   SUMAtriptan  (IMITREX ) 50 MG tablet, Take 1 tablet (50 mg total) by mouth once as needed for migraine. May repeat in 2 hours if headache persists or recurs., Disp: 30 tablet, Rfl: 2   phentermine  37.5 MG capsule, Take 1 capsule (37.5 mg total) by mouth every morning., Disp: 30 capsule, Rfl: 1    Allergies  Allergen Reactions   Famotidine  Other (See Comments)    Headache     Review of Systems  Constitutional: Negative.  Negative for chills, fatigue and fever.  HENT:  Negative for congestion.   Respiratory:  Negative for cough, chest tightness and shortness of breath.   Cardiovascular:  Negative for chest pain and palpitations.  Gastrointestinal:  Negative for abdominal pain.  Endocrine: Negative for polydipsia, polyphagia and polyuria.  Neurological:  Negative for dizziness and headaches.  Psychiatric/Behavioral: Negative.       Today's Vitals   08/17/24 1550  BP: 110/60  Pulse: 90  Temp: 98.5 F (36.9 C)  TempSrc: Oral  Weight: 217 lb (98.4 kg)  Height: 5' 5 (1.651 m)  PainSc: 0-No pain   Body mass index is 36.11 kg/m.  Wt Readings from Last 3 Encounters:  08/17/24 217 lb (98.4 kg)  08/02/24 216 lb 11.4 oz (98.3 kg)  06/15/24 216 lb 12.8 oz (98.3 kg)      Objective:  Physical Exam Vitals and nursing note reviewed.  Constitutional:      General: She is not in acute distress.    Appearance: Normal appearance. She is well-developed. She is obese.  Cardiovascular:     Rate and Rhythm: Normal rate and regular rhythm.     Pulses: Normal pulses.     Heart sounds: Normal heart sounds. No murmur heard. Pulmonary:     Effort: Pulmonary effort is normal. No respiratory distress.     Breath sounds: Normal breath sounds. No wheezing.  Neurological:     General: No focal deficit present.     Mental Status: She is alert and oriented to person, place, and time.     Cranial Nerves: No cranial nerve deficit.     Motor: No weakness.  Psychiatric:        Mood and Affect: Mood normal. Mood is not anxious.        Speech: Speech normal.        Behavior: Behavior normal. Behavior is not agitated.        08/17/2024    4:15 PM 06/15/2024    3:01 PM 01/01/2024    3:29 PM  Depression screen PHQ 2/9  Decreased Interest 2 0 1  Down, Depressed, Hopeless 3 0 2  PHQ - 2  Score 5 0 3  Altered sleeping 3 0 2  Tired, decreased energy 2 0 3  Change in appetite 1 0 1  Feeling bad or failure about yourself  3 0 3  Trouble concentrating 2 0 0  Moving slowly or fidgety/restless 1 0 0  Suicidal thoughts 2 0 1  PHQ-9 Score 19 0 13  Difficult doing work/chores Very difficult Not difficult at all Somewhat difficult      08/17/2024    4:15 PM  01/01/2024    3:29 PM  GAD 7 : Generalized Anxiety Score  Nervous, Anxious, on Edge 3 1  Control/stop worrying 3 2  Worry too much - different things 3 2  Trouble relaxing 3 3  Restless 2 1  Easily annoyed or irritable 2 2  Afraid - awful might happen 2 0  Total GAD 7 Score 18 11  Anxiety Difficulty Very difficult Very difficult    Assessment And Plan:  Class 2 severe obesity due to excess calories with serious comorbidity and body mass index (BMI) of 36.0 to 36.9 in adult Assessment & Plan: She is encouraged to strive for BMI less than 30 to decrease cardiac risk. Advised to aim for at least 150 minutes of exercise per week. She was denied Wegovy  and will call her insurance company to see what is covered.   Orders: -     Phentermine  HCl; Take 1 capsule (37.5 mg total) by mouth every morning.  Dispense: 30 capsule; Refill: 1  Influenza vaccination declined  Anxiety and depression Assessment & Plan: Anxiety score is 19 and depression score is 18. On escitalopram  20 mg daily, reports significant symptoms including nightmares and difficulty sleeping. - Attend therapy session on Friday. - Consider hydroxyzine  for nighttime anxiety and sleep issues. - Evaluate need for additional medication after therapy session.   Other orders -     hydrOXYzine  HCl; Take 1 tablet (10 mg total) by mouth every 8 (eight) hours as needed. May take 2 tabs by mouth at bedtime for sleep  Dispense: 30 tablet; Refill: 1    Return for 2 months weight check, NV in 2w for FLU.  Patient was given opportunity to ask questions. Patient  verbalized understanding of the plan and was able to repeat key elements of the plan. All questions were answered to their satisfaction.     LILLETTE [name], FNP, have reviewed all documentation for this visit. The documentation on 08/17/24 for the exam, diagnosis, procedures, and orders are all accurate and complete.  IF YOU HAVE BEEN REFERRED TO A SPECIALIST, IT MAY TAKE 1-2 WEEKS TO SCHEDULE/PROCESS THE REFERRAL. IF YOU HAVE NOT HEARD FROM US /SPECIALIST IN TWO WEEKS, PLEASE GIVE US  A CALL AT [phone].

## 2024-08-18 ENCOUNTER — Encounter: Payer: Self-pay | Admitting: Nurse Practitioner

## 2024-08-21 DIAGNOSIS — F4321 Adjustment disorder with depressed mood: Secondary | ICD-10-CM | POA: Diagnosis not present

## 2024-08-23 DIAGNOSIS — F32A Depression, unspecified: Secondary | ICD-10-CM | POA: Insufficient documentation

## 2024-08-23 NOTE — Assessment & Plan Note (Addendum)
 Anxiety score is 19 and depression score is 18. On escitalopram  20 mg daily, reports significant symptoms including nightmares and difficulty sleeping. - Attend therapy session on Friday. - Consider hydroxyzine  for nighttime anxiety and sleep issues. - Evaluate need for additional medication after therapy session.

## 2024-08-23 NOTE — Assessment & Plan Note (Signed)
 She is encouraged to strive for BMI less than 30 to decrease cardiac risk. Advised to aim for at least 150 minutes of exercise per week. She was denied [place] and will call her insurance company to see what is covered.

## 2024-08-25 ENCOUNTER — Other Ambulatory Visit: Payer: Self-pay | Admitting: Nurse Practitioner

## 2024-08-25 MED ORDER — TRAZODONE HCL 50 MG PO TABS: 50.0000 mg | ORAL_TABLET | Freq: Every day | ORAL | 2 refills | Status: DC

## 2024-09-04 ENCOUNTER — Ambulatory Visit (INDEPENDENT_AMBULATORY_CARE_PROVIDER_SITE_OTHER)

## 2024-09-04 VITALS — BP 108/74 | Temp 98.3°F | Ht 65.0 in | Wt 217.0 lb

## 2024-09-04 DIAGNOSIS — Z23 Encounter for immunization: Secondary | ICD-10-CM

## 2024-09-04 NOTE — Progress Notes (Signed)
 Patient is in office today for a nurse visit for a flu Immunization. Patient Injection was given in the  Left deltoid. Patient tolerated injection well.

## 2024-09-04 NOTE — Patient Instructions (Signed)

## 2024-09-22 ENCOUNTER — Encounter: Payer: Self-pay | Admitting: Nurse Practitioner

## 2024-09-27 ENCOUNTER — Emergency Department
Admission: EM | Admit: 2024-09-27 | Discharge: 2024-09-28 | Disposition: A | Discharge status: Home / Self Care | Attending: Emergency Medicine | Admitting: Emergency Medicine

## 2024-09-27 ENCOUNTER — Emergency Department

## 2024-09-27 ENCOUNTER — Other Ambulatory Visit: Payer: Self-pay

## 2024-09-27 DIAGNOSIS — R112 Nausea with vomiting, unspecified: Secondary | ICD-10-CM | POA: Diagnosis not present

## 2024-09-27 DIAGNOSIS — D72829 Elevated white blood cell count, unspecified: Secondary | ICD-10-CM | POA: Insufficient documentation

## 2024-09-27 DIAGNOSIS — R079 Chest pain, unspecified: Secondary | ICD-10-CM | POA: Diagnosis not present

## 2024-09-27 DIAGNOSIS — R0789 Other chest pain: Secondary | ICD-10-CM | POA: Insufficient documentation

## 2024-09-27 DIAGNOSIS — R197 Diarrhea, unspecified: Secondary | ICD-10-CM | POA: Diagnosis not present

## 2024-09-27 DIAGNOSIS — A0811 Acute gastroenteropathy due to Norwalk agent: Secondary | ICD-10-CM

## 2024-09-27 LAB — URINALYSIS, W/ REFLEX TO CULTURE (INFECTION SUSPECTED)
Bacteria, UA: NONE SEEN
Bilirubin Urine: NEGATIVE
Glucose, UA: NEGATIVE mg/dL
Ketones, ur: NEGATIVE mg/dL
Nitrite: NEGATIVE
Protein, ur: 30 mg/dL — AB
Specific Gravity, Urine: 1.032 — ABNORMAL HIGH (ref 1.005–1.030)
pH: 5 (ref 5.0–8.0)

## 2024-09-27 LAB — CBC
HCT: 44.5 % (ref 36.0–46.0)
Hemoglobin: 14.5 g/dL (ref 12.0–15.0)
MCH: 28.7 pg (ref 26.0–34.0)
MCHC: 32.6 g/dL (ref 30.0–36.0)
MCV: 87.9 fL (ref 80.0–100.0)
Platelets: 495 K/uL — ABNORMAL HIGH (ref 150–400)
RBC: 5.06 MIL/uL (ref 3.87–5.11)
RDW: 13.2 % (ref 11.5–15.5)
WBC: 24.6 K/uL — ABNORMAL HIGH (ref 4.0–10.5)
nRBC: 0 % (ref 0.0–0.2)

## 2024-09-27 LAB — COMPREHENSIVE METABOLIC PANEL WITH GFR
ALT: 36 U/L (ref 0–44)
AST: 33 U/L (ref 15–41)
Albumin: 4.5 g/dL (ref 3.5–5.0)
Alkaline Phosphatase: 117 U/L (ref 38–126)
Anion gap: 13 (ref 5–15)
BUN: 16 mg/dL (ref 6–20)
CO2: 20 mmol/L — ABNORMAL LOW (ref 22–32)
Calcium: 9.4 mg/dL (ref 8.9–10.3)
Chloride: 105 mmol/L (ref 98–111)
Creatinine, Ser: 0.9 mg/dL (ref 0.44–1.00)
GFR, Estimated: >60 mL/min (ref >60)
Glucose, Bld: 162 mg/dL — ABNORMAL HIGH (ref 70–99)
Potassium: 3.7 mmol/L (ref 3.5–5.1)
Sodium: 138 mmol/L (ref 135–145)
Total Bilirubin: 0.7 mg/dL (ref 0.0–1.2)
Total Protein: 8.5 g/dL — ABNORMAL HIGH (ref 6.5–8.1)

## 2024-09-27 LAB — LACTIC ACID, PLASMA: Lactic Acid, Venous: 1.3 mmol/L (ref 0.5–1.9)

## 2024-09-27 LAB — HCG, QUANTITATIVE, PREGNANCY: hCG, Beta Chain, Quant, S: 1 m[IU]/mL (ref <5)

## 2024-09-27 LAB — LIPASE, BLOOD: Lipase: 24 U/L (ref 11–51)

## 2024-09-27 LAB — TROPONIN I (HIGH SENSITIVITY)
Troponin I (High Sensitivity): <2 ng/L (ref <18)
Troponin I (High Sensitivity): <2 ng/L (ref <18)

## 2024-09-27 MED ORDER — ONDANSETRON HCL 4 MG/2ML IJ SOLN
4.0000 mg | Freq: Once | INTRAMUSCULAR | Status: AC
  Administered 2024-09-27: 4 mg via INTRAVENOUS
  Filled 2024-09-27: qty 2

## 2024-09-27 MED ORDER — ACETAMINOPHEN 10 MG/ML IV SOLN
1000.0000 mg | INTRAVENOUS | Status: AC
  Administered 2024-09-27: 1000 mg via INTRAVENOUS
  Filled 2024-09-27 (×2): qty 100

## 2024-09-27 MED ORDER — SODIUM CHLORIDE 0.9 % IV BOLUS
1000.0000 mL | Freq: Once | INTRAVENOUS | Status: AC
  Administered 2024-09-27: 1000 mL via INTRAVENOUS

## 2024-09-27 NOTE — ED Provider Notes (Signed)
 [facility] Provider Note    Event Date/Time   First MD Initiated Contact with Patient 09/27/24 2057     (approximate)   History   Chest Pain   HPI {Remember to add pertinent medical, surgical, social, and/or OB history to HPI:1} Mia Ortiz is a 33 y.o. female with a history of prediabetes, headaches, insomnia, anxiety, seasonal allergies and leukopenia     Physical Exam   Triage Vital Signs: ED Triage Vitals  Encounter Vitals Group     BP 09/27/24 2006 (!) 132/93     Girls Systolic BP Percentile --      Girls Diastolic BP Percentile --      Boys Systolic BP Percentile --      Boys Diastolic BP Percentile --      Pulse Rate 09/27/24 2006 95     Resp 09/27/24 2006 19     Temp 09/27/24 2006 97.8 F (36.6 C)     Temp src --      SpO2 09/27/24 2006 100 %     Weight 09/27/24 2005 213 lb (96.6 kg)     Height 09/27/24 2005 5' 5 (1.651 m)     Head Circumference --      Peak Flow --      Pain Score 09/27/24 2005 9     Pain Loc --      Pain Education --      Exclude from Growth Chart --     Most recent vital signs: Vitals:   09/27/24 2006  BP: (!) 132/93  Pulse: 95  Resp: 19  Temp: 97.8 F (36.6 C)  SpO2: 100%    {Only need to document appropriate and relevant physical exam:1} General: Awake, no distress. *** CV:  Good peripheral perfusion. *** Resp:  Normal effort. *** Abd:  No distention. *** Other:  ***   ED Results / Procedures / Treatments   Labs (all labs ordered are listed, but only abnormal results are displayed) Labs Reviewed  CBC - Abnormal; Notable for the following components:      Result Value   WBC 24.6 (*)    Platelets 495 (*)    All other components within normal limits  COMPREHENSIVE METABOLIC PANEL WITH GFR  LIPASE, BLOOD  POC URINE PREG, ED  TROPONIN I (HIGH SENSITIVITY)     EKG  And interpreted by me at 2010 heart rate 95 QRS 80 QTc 440 Sinus tachycardia.  No evidence of frank  ischemia.   RADIOLOGY *** {USE THE WORD INTERPRETED!! You MUST document your own interpretation of imaging, as well as the fact that you reviewed the radiologist's report!:1}   PROCEDURES:  Critical Care performed: {CriticalCareYesNo:19197::Yes, see critical care procedure note(s),No}  Procedures   MEDICATIONS ORDERED IN ED: Medications  ondansetron  (ZOFRAN ) injection 4 mg (4 mg Intravenous Given 09/27/24 2009)     IMPRESSION / MDM / ASSESSMENT AND PLAN / ED COURSE  I reviewed the triage vital signs and the nursing notes.                              Differential diagnosis includes, but is not limited to, ***  Patient's presentation is most consistent with {EM COPA:27473}  *** {If the patient is on the monitor, remove the brackets and asterisks on the sentence below and remember to document it as a Procedure as well. Otherwise delete the sentence below:1} {**The patient is on the cardiac  monitor to evaluate for evidence of arrhythmia and/or significant heart rate changes.**} {Remember to include, when applicable, any/all of the following data: independent review of imaging independent review of labs (comment specifically on pertinent positives and negatives) review of specific prior hospitalizations, PCP/specialist notes, etc. discuss meds given and prescribed document any discussion with consultants (including hospitalists) any clinical decision tools you used and why (PECARN, NEXUS, etc.) did you consider admitting the patient? document social determinants of health affecting patient's care (homelessness, inability to follow up in a timely fashion, etc) document any pre-existing conditions increasing risk on current visit (e.g. diabetes and HTN increasing danger of high-risk chest pain/ACS) describes what meds you gave (especially parenteral) and why any other interventions?:1}     FINAL CLINICAL IMPRESSION(S) / ED DIAGNOSES   Final diagnoses:  None      Rx / DC Orders   ED Discharge Orders     None        Note:  This document was prepared using Dragon voice recognition software and may include unintentional dictation errors.

## 2024-09-27 NOTE — ED Triage Notes (Signed)
 Pt reports chest pain and abd pain that began earlier tonight that was followed by n/v. Pt denies known cardiac hx.

## 2024-09-28 ENCOUNTER — Emergency Department

## 2024-09-28 ENCOUNTER — Telehealth: Payer: Self-pay

## 2024-09-28 DIAGNOSIS — R112 Nausea with vomiting, unspecified: Secondary | ICD-10-CM | POA: Diagnosis not present

## 2024-09-28 DIAGNOSIS — R109 Unspecified abdominal pain: Secondary | ICD-10-CM | POA: Diagnosis not present

## 2024-09-28 DIAGNOSIS — Z9049 Acquired absence of other specified parts of digestive tract: Secondary | ICD-10-CM | POA: Diagnosis not present

## 2024-09-28 LAB — GASTROINTESTINAL PANEL BY PCR, STOOL (REPLACES STOOL CULTURE)

## 2024-09-28 LAB — PROTIME-INR
INR: 1 (ref 0.8–1.2)
Prothrombin Time: 14.2 s (ref 11.4–15.2)

## 2024-09-28 LAB — C DIFFICILE QUICK SCREEN W PCR REFLEX
C Diff antigen: NEGATIVE
C Diff interpretation: NOT DETECTED
C Diff toxin: NEGATIVE

## 2024-09-28 LAB — LACTIC ACID, PLASMA: Lactic Acid, Venous: 1.1 mmol/L (ref 0.5–1.9)

## 2024-09-28 LAB — D-DIMER, QUANTITATIVE: D-Dimer, Quant: 0.45 ug{FEU}/mL (ref 0.00–0.50)

## 2024-09-28 MED ORDER — IOHEXOL 300 MG/ML  SOLN
100.0000 mL | Freq: Once | INTRAMUSCULAR | Status: AC | PRN
  Administered 2024-09-28: 100 mL via INTRAVENOUS

## 2024-09-28 MED ORDER — ONDANSETRON 4 MG PO TBDP: 4.0000 mg | ORAL_TABLET | Freq: Three times a day (TID) | ORAL | 0 refills | Status: AC | PRN

## 2024-09-28 NOTE — Discharge Instructions (Signed)
 You were diagnosed with norovirus which can cause nausea vomiting diarrhea and cramping abdominal pain.  Make sure you stay well-hydrated by drinking plenty of fluids, find Pedialyte or similar electrolyte rehydration formulas at your local pharmacy and drink throughout the day.  You can take ondansetron  as prescribed to help with the nausea.  Thank you for choosing us  for your health care today!  Please see your primary doctor this week for a follow up appointment.   If you have any new, worsening, or unexpected symptoms call your doctor right away or come back to the emergency department for reevaluation.

## 2024-09-28 NOTE — ED Provider Notes (Signed)
 Patient with multiple complaints including resolved chest pain, vomiting diarrhea and abdominal pain.  D-dimer negative, low risk, defer CT angiogram of the chest as PE deemed ruled out.  Stable vital signs currently.  CT abdomen pelvis ordered.  Enteritis noted on CT abdomen and pelvis and positive for norovirus on stool testing.  Discharged in stable condition, Zofran  prescribed for patient.  Follow-up PMD.   Cyrena Mylar, MD 09/28/24 [phone]

## 2024-09-28 NOTE — Transitions of Care (Post Inpatient/ED Visit) (Signed)
   09/28/2024  Name: Mia Ortiz MRN: [id number] DOB: 04-19-1991  Today's TOC FU Call Status: Today's TOC FU Call Status:: Unsuccessful Call (1st Attempt) Unsuccessful Call (1st Attempt) Date: 09/28/24  Attempted to reach the patient regarding the most recent Inpatient/ED visit.  Follow Up Plan: Additional outreach attempts will be made to reach the patient to complete the Transitions of Care (Post Inpatient/ED visit) call.   Signature  Kristeen Lunger, CMA

## 2024-09-29 ENCOUNTER — Telehealth: Payer: Self-pay

## 2024-09-29 NOTE — Transitions of Care (Post Inpatient/ED Visit) (Signed)
   09/29/2024  Name: Mia Ortiz MRN: [id number] DOB: 02-18-1991  Today's TOC FU Call Status: Today's TOC FU Call Status:: Successful TOC FU Call Completed TOC FU Call Complete Date: 09/29/24 Patient's Name and Date of Birth confirmed.  Transition Care Management Follow-up Telephone Call Date of Discharge: 09/27/24 Discharge Facility: [facility]) Type of Discharge: Inpatient Admission Primary Inpatient Discharge Diagnosis:: Atypical chest pain - Nausea vomiting and diarrhea-  Leukocytosis, unspecified type - Norovirus How have you been since you were released from the hospital?: Same Any questions or concerns?: No  Items Reviewed: Did you receive and understand the discharge instructions provided?: Yes Medications obtained,verified, and reconciled?: Yes (Medications Reviewed) Any new allergies since your discharge?: No Dietary orders reviewed?: No Do you have support at home?: Yes People in Home [RPT]: friend(s)  Medications Reviewed Today: Medications Reviewed Today   Medications were not reviewed in this encounter     Home Care and Equipment/Supplies: Were Home Health Services Ordered?: No Any new equipment or medical supplies ordered?: No  Functional Questionnaire: Do you need assistance with bathing/showering or dressing?: No Do you need assistance with meal preparation?: No Do you need assistance with eating?: No Do you have difficulty maintaining continence: No Do you need assistance with getting out of bed/getting out of a chair/moving?: No Do you have difficulty managing or taking your medications?: No  Follow up appointments reviewed: PCP Follow-up appointment confirmed?: Yes Date of PCP follow-up appointment?: 10/21/24 Follow-up Provider: [facility] Follow-up appointment confirmed?: NA Do you need transportation to your follow-up appointment?: No Do you understand care options if your condition(s) worsen?:  Yes-patient verbalized understanding    SIGNATURE Kristeen Lunger, CMA

## 2024-09-30 ENCOUNTER — Encounter: Payer: Self-pay | Admitting: Nurse Practitioner

## 2024-10-01 ENCOUNTER — Inpatient Hospital Stay: Admitting: Family Medicine

## 2024-10-02 LAB — CULTURE, BLOOD (ROUTINE X 2): Culture: NO GROWTH

## 2024-10-21 ENCOUNTER — Ambulatory Visit: Payer: Self-pay | Admitting: Nurse Practitioner

## 2024-10-26 DIAGNOSIS — Z3169 Encounter for other general counseling and advice on procreation: Secondary | ICD-10-CM | POA: Diagnosis not present

## 2024-10-26 DIAGNOSIS — N939 Abnormal uterine and vaginal bleeding, unspecified: Secondary | ICD-10-CM | POA: Diagnosis not present

## 2024-10-26 DIAGNOSIS — Z01411 Encounter for gynecological examination (general) (routine) with abnormal findings: Secondary | ICD-10-CM | POA: Diagnosis not present

## 2024-11-05 ENCOUNTER — Ambulatory Visit: Admitting: Family Medicine

## 2024-11-05 ENCOUNTER — Encounter: Payer: Self-pay | Admitting: Family Medicine

## 2024-11-05 VITALS — BP 116/70 | HR 87 | Temp 98.6°F | Ht 65.0 in | Wt 220.0 lb

## 2024-11-05 DIAGNOSIS — F331 Major depressive disorder, recurrent, moderate: Secondary | ICD-10-CM | POA: Insufficient documentation

## 2024-11-05 DIAGNOSIS — G4709 Other insomnia: Secondary | ICD-10-CM | POA: Diagnosis not present

## 2024-11-05 DIAGNOSIS — G43909 Migraine, unspecified, not intractable, without status migrainosus: Secondary | ICD-10-CM | POA: Diagnosis not present

## 2024-11-05 DIAGNOSIS — F411 Generalized anxiety disorder: Secondary | ICD-10-CM

## 2024-11-05 DIAGNOSIS — E66812 Obesity, class 2: Secondary | ICD-10-CM

## 2024-11-05 DIAGNOSIS — F5081 Binge eating disorder, mild: Secondary | ICD-10-CM | POA: Insufficient documentation

## 2024-11-05 DIAGNOSIS — Z6836 Body mass index (BMI) 36.0-36.9, adult: Secondary | ICD-10-CM | POA: Diagnosis not present

## 2024-11-05 MED ORDER — PHENTERMINE HCL 30 MG PO CAPS: 30.0000 mg | ORAL_CAPSULE | ORAL | 0 refills | Status: AC

## 2024-11-05 MED ORDER — TOPIRAMATE 50 MG PO TABS: 50.0000 mg | ORAL_TABLET | Freq: Every day | ORAL | 1 refills | Status: AC

## 2024-11-05 MED ORDER — HYDROXYZINE HCL 10 MG PO TABS: 10.0000 mg | ORAL_TABLET | Freq: Three times a day (TID) | ORAL | 1 refills | Status: AC | PRN

## 2024-11-05 MED ORDER — TRAZODONE HCL 50 MG PO TABS: 100.0000 mg | ORAL_TABLET | Freq: Every day | ORAL | 1 refills | Status: AC

## 2024-11-05 MED ORDER — ESCITALOPRAM OXALATE 10 MG PO TABS: 10.0000 mg | ORAL_TABLET | Freq: Every day | ORAL | 1 refills | Status: AC

## 2024-11-05 NOTE — Patient Instructions (Signed)

## 2024-11-05 NOTE — Progress Notes (Signed)
 I,[name], CMA,acting as a neurosurgeon for [employer], NP.,have documented all relevant documentation on the behalf of Bruna Creighton, NP,as directed by  Bruna Creighton, NP while in the presence of Bruna Creighton, NP.  Subjective:  Patient ID: Mia Ortiz , female    DOB: Sep 27, 1991 , 33 y.o.   MRN: [id number]  Chief Complaint  Patient presents with   Weight Check    Patient presents today for a weight check. Patient reports compliance with her meds. Patient doesn't have any questions or concerns at this time.   hospital f/u    Patient reports she was recently in the hospital and diagnosed with novovirus. She reports she is feeling better.    Depression    Patient reports she would like to discuss her anxiety and depression.     HPI Discussed the use of AI scribe software for clinical note transcription with the patient, who gave verbal consent to proceed.  History of Present Illness   Mia Ortiz is a 33 year old female who presents for weight management.  She has been taking phentermine  50 mg daily since April for weight management. There was a break in her medication regimen due to a hospitalization for norovirus, during which she was unable to maintain her medication schedule. She resumed taking phentermine  about a week and a half ago.  She has a history of migraines and uses Imitrex  as needed for acute episodes. Her migraines occur infrequently but are debilitating when they do occur. She has tried Topamax  for migraine prevention.  She experiences significant anxiety and depression, for which she takes escitalopram  20 mg once daily in the morning. Her anxiety and depression symptoms have been severe recently, and she feels that her current medication regimen is not adequately managing these symptoms. She has a therapist.  For sleep, she takes trazodone  at night, approximately an hour to an hour and a half before bed. She can sleep for three to four hours before waking up  and being unable to return to sleep. She has previously used hydroxyzine  for anxiety and sleep but prefers trazodone  for sleep. She feels tired during the day and notes that her coworkers have commented on her appearing 'out of it'.  She was hospitalized recently due to a stomach virus, specifically norovirus, and reports that her A1c was good in February.  No chest pain and regular bowel movements.      Mia Ortiz is a 33 year old female who presents for weight management.  She has been taking phentermine  50 mg daily since April for weight management. There was a break in her medication regimen due to a hospitalization for norovirus, during which she was unable to maintain her medication schedule. She resumed taking phentermine  about a week and a half ago.  She has a history of migraines and uses Imitrex  as needed for acute episodes. Her migraines occur infrequently but are debilitating when they do occur. She has tried Topamax  for migraine prevention.  She experiences significant anxiety and depression, for which she takes escitalopram  20 mg once daily in the morning. Her anxiety and depression symptoms have been severe recently, and she feels that her current medication regimen is not adequately managing these symptoms. She has a therapist.  For sleep, she takes trazodone  at night, approximately an hour to an hour and a half before bed. She can sleep for three to four hours before waking up and being unable to return to sleep. She has previously used  hydroxyzine  for anxiety and sleep but prefers trazodone  for sleep. She feels tired during the day and notes that her coworkers have commented on her appearing 'out of it'.  She was hospitalized recently due to a stomach virus, specifically norovirus, and reports that her A1c was good in February.  No chest pain and regular bowel movements.          Past Medical History:  Diagnosis Date   Leukopenia 10/14/2013   Pre-diabetes       Family History  Problem Relation Age of Onset   Hyperlipidemia Mother    Cancer Paternal Uncle        lung cancer   Cancer Maternal Grandmother        cervical cancer   Cancer Maternal Grandfather        stomach ca    Current Medications[1]   Allergies[2]   Review of Systems  Constitutional: Negative.   Respiratory: Negative.    Cardiovascular: Negative.   Genitourinary: Negative.   Musculoskeletal: Negative.   Psychiatric/Behavioral:  Positive for dysphoric mood and sleep disturbance. The patient is nervous/anxious.      Today's Vitals   11/05/24 1158  BP: 116/70  Pulse: 87  Temp: 98.6 F (37 C)  TempSrc: Oral  Weight: 220 lb (99.8 kg)  Height: 5' 5 (1.651 m)  PainSc: 0-No pain   Body mass index is 36.61 kg/m.  Wt Readings from Last 3 Encounters:  11/05/24 220 lb (99.8 kg)  09/27/24 213 lb (96.6 kg)  09/04/24 217 lb (98.4 kg)    The ASCVD Risk score (Arnett DK, et al., 2019) failed to calculate for the following reasons:   The 2019 ASCVD risk score is only valid for ages 44 to 31   * - Cholesterol units were assumed  Objective:  Physical Exam HENT:     Head: Normocephalic.  Cardiovascular:     Rate and Rhythm: Normal rate and regular rhythm.  Pulmonary:     Effort: Pulmonary effort is normal.     Breath sounds: Normal breath sounds.  Musculoskeletal:        General: Normal range of motion.  Neurological:     General: No focal deficit present.     Mental Status: She is alert and oriented to person, place, and time.         Assessment And Plan:   Assessment & Plan Binge eating disorder, mild - Prescribed Topamax  for migraine prevention and binge eating  - Advised discontinuing phentermine  if migraines worsen. GAD (generalized anxiety disorder) Take hydroxyzine  as needed Moderate episode of recurrent major depressive disorder (HCC) Continue Lexapro  daily and therapy sessions Other insomnia  Trazodone  provides limited relief. - Increased  trazodone  dose to 100 mg for improved sleep. - Reduced phentermine  and advised taking phentermine  early in the morning to avoid insomnia Migraine syndrome Added Topamax  for migraine prevention Class 2 severe obesity due to excess calories with serious comorbidity and body mass index (BMI) of 36.0 to 36.9 in adult Reduced phentermine  dose to 30 mg and a possible break for a few months since its a stimulant, gradually replace with exercise. She voiced understanding.      Return for 2 month weight check.  Patient was given opportunity to ask questions. Patient verbalized understanding of the plan and was able to repeat key elements of the plan. All questions were answered to their satisfaction.    I, Bruna Creighton, NP, have reviewed all documentation for this visit. The documentation on 11/17/2024 for the  exam, diagnosis, procedures, and orders are all accurate and complete.   IF YOU HAVE BEEN REFERRED TO A SPECIALIST, IT MAY TAKE 1-2 WEEKS TO SCHEDULE/PROCESS THE REFERRAL. IF YOU HAVE NOT HEARD FROM US /SPECIALIST IN TWO WEEKS, PLEASE GIVE US  A CALL AT [phone].      [1]  Current Outpatient Medications:    albuterol  (VENTOLIN  HFA) 108 (90 Base) MCG/ACT inhaler, Inhale 2 puffs into the lungs every 6 (six) hours as needed for wheezing or shortness of breath., Disp: 8 g, Rfl: 2   diphenhydrAMINE  (BENADRYL ) 25 MG tablet, Take 25 mg by mouth at bedtime., Disp: , Rfl:    etonogestrel-ethinyl estradiol (NUVARING) 0.12-0.015 MG/24HR vaginal ring, Place 1 each vaginally every 28 (twenty-eight) days., Disp: , Rfl:    fluticasone  (FLONASE ) 50 MCG/ACT nasal spray, Place 1 spray into both nostrils 2 (two) times daily., Disp: 16 g, Rfl: 2   ipratropium (ATROVENT ) 0.06 % nasal spray, Place 2 sprays into both nostrils 4 (four) times daily., Disp: 15 mL, Rfl: 12   Lemborexant  (DAYVIGO ) 5 MG TABS, One tab po qd, Disp: 30 tablet, Rfl: 1   ondansetron  (ZOFRAN -ODT) 4 MG disintegrating tablet, Take 1  tablet (4 mg total) by mouth every 8 (eight) hours as needed for nausea or vomiting., Disp: 20 tablet, Rfl: 0   phentermine  30 MG capsule, Take 1 capsule (30 mg total) by mouth every morning., Disp: 30 capsule, Rfl: 0   SUMAtriptan  (IMITREX ) 50 MG tablet, Take 1 tablet (50 mg total) by mouth once as needed for migraine. May repeat in 2 hours if headache persists or recurs., Disp: 30 tablet, Rfl: 2   topiramate  (TOPAMAX ) 50 MG tablet, Take 1 tablet (50 mg total) by mouth daily., Disp: 90 tablet, Rfl: 1   traZODone  (DESYREL ) 50 MG tablet, Take 2 tablets (100 mg total) by mouth at bedtime. Take one and half to 2 tablets as needed daily for sleep, Disp: 180 tablet, Rfl: 1   escitalopram  (LEXAPRO ) 10 MG tablet, Take 1 tablet (10 mg total) by mouth daily., Disp: 90 tablet, Rfl: 1   hydrOXYzine  (ATARAX ) 10 MG tablet, Take 1 tablet (10 mg total) by mouth every 8 (eight) hours as needed. May take 2 tabs by mouth at bedtime for sleep, Disp: 30 tablet, Rfl: 1 [2]  Allergies Allergen Reactions   Famotidine  Other (See Comments)    Headache

## 2024-11-18 DIAGNOSIS — G43909 Migraine, unspecified, not intractable, without status migrainosus: Secondary | ICD-10-CM | POA: Insufficient documentation

## 2024-11-18 NOTE — Assessment & Plan Note (Signed)
 Added Topamax  for migraine prevention

## 2024-11-18 NOTE — Assessment & Plan Note (Signed)
 Continue Lexapro  daily and therapy sessions

## 2024-11-18 NOTE — Assessment & Plan Note (Signed)
-   Prescribed Topamax  for migraine prevention and binge eating  - Advised discontinuing phentermine  if migraines worsen.

## 2024-11-18 NOTE — Assessment & Plan Note (Signed)
 Reduced phentermine  dose to 30 mg and a possible break for a few months since its a stimulant, gradually replace with exercise. She voiced understanding.

## 2024-11-18 NOTE — Assessment & Plan Note (Signed)
 Take hydroxyzine as needed

## 2024-11-18 NOTE — Assessment & Plan Note (Signed)
"   Trazodone  provides limited relief. - Increased trazodone  dose to 100 mg for improved sleep. - Reduced phentermine  and advised taking phentermine  early in the morning to avoid insomnia "

## 2024-12-29 ENCOUNTER — Ambulatory Visit: Payer: Self-pay

## 2025-01-06 ENCOUNTER — Ambulatory Visit: Payer: Self-pay | Admitting: Nurse Practitioner

## 2025-01-19 ENCOUNTER — Ambulatory Visit: Admitting: Nurse Practitioner
